# Patient Record
Sex: Female | Born: 1955 | Race: White | Hispanic: No | Marital: Married | Smoking: Never smoker
Health system: Southern US, Community
[De-identification: ages and names within clinical notes are randomized; demographics above are authoritative.]

## PROBLEM LIST (undated history)

## (undated) DIAGNOSIS — G35 Multiple sclerosis: Secondary | ICD-10-CM

## (undated) DIAGNOSIS — R112 Nausea with vomiting, unspecified: Secondary | ICD-10-CM

## (undated) DIAGNOSIS — Z9889 Other specified postprocedural states: Secondary | ICD-10-CM

## (undated) HISTORY — PX: CHOLECYSTECTOMY: SHX55

---

## 1998-01-13 ENCOUNTER — Other Ambulatory Visit: Admission: RE | Admit: 1998-01-13 | Discharge: 1998-01-13 | Payer: Self-pay | Admitting: *Deleted

## 1999-05-04 ENCOUNTER — Encounter: Admission: RE | Admit: 1999-05-04 | Discharge: 1999-05-04 | Payer: Self-pay | Admitting: *Deleted

## 1999-05-14 ENCOUNTER — Other Ambulatory Visit: Admission: RE | Admit: 1999-05-14 | Discharge: 1999-05-14 | Payer: Self-pay | Admitting: *Deleted

## 2000-03-09 ENCOUNTER — Encounter: Admission: RE | Admit: 2000-03-09 | Discharge: 2000-03-09 | Payer: Self-pay | Admitting: Family Medicine

## 2000-03-09 ENCOUNTER — Encounter: Payer: Self-pay | Admitting: Family Medicine

## 2000-07-20 ENCOUNTER — Encounter: Admission: RE | Admit: 2000-07-20 | Discharge: 2000-07-20 | Payer: Self-pay | Admitting: *Deleted

## 2000-08-10 ENCOUNTER — Other Ambulatory Visit: Admission: RE | Admit: 2000-08-10 | Discharge: 2000-08-10 | Payer: Self-pay | Admitting: *Deleted

## 2001-09-14 ENCOUNTER — Encounter: Admission: RE | Admit: 2001-09-14 | Discharge: 2001-09-14 | Payer: Self-pay | Admitting: *Deleted

## 2001-09-18 ENCOUNTER — Other Ambulatory Visit: Admission: RE | Admit: 2001-09-18 | Discharge: 2001-09-18 | Payer: Self-pay | Admitting: *Deleted

## 2002-09-16 ENCOUNTER — Encounter: Admission: RE | Admit: 2002-09-16 | Discharge: 2002-09-16 | Payer: Self-pay | Admitting: *Deleted

## 2002-09-24 ENCOUNTER — Other Ambulatory Visit: Admission: RE | Admit: 2002-09-24 | Discharge: 2002-09-24 | Payer: Self-pay | Admitting: *Deleted

## 2003-10-21 ENCOUNTER — Encounter: Admission: RE | Admit: 2003-10-21 | Discharge: 2003-10-21 | Payer: Self-pay | Admitting: *Deleted

## 2003-11-07 ENCOUNTER — Encounter: Admission: RE | Admit: 2003-11-07 | Discharge: 2003-11-07 | Payer: Self-pay | Admitting: *Deleted

## 2004-03-31 ENCOUNTER — Encounter: Admission: RE | Admit: 2004-03-31 | Discharge: 2004-03-31 | Payer: Self-pay | Admitting: *Deleted

## 2004-11-04 ENCOUNTER — Encounter: Admission: RE | Admit: 2004-11-04 | Discharge: 2004-11-04 | Payer: Self-pay | Admitting: Neurology

## 2004-11-05 ENCOUNTER — Encounter: Admission: RE | Admit: 2004-11-05 | Discharge: 2004-11-05 | Payer: Self-pay | Admitting: *Deleted

## 2005-01-05 ENCOUNTER — Encounter: Admission: RE | Admit: 2005-01-05 | Discharge: 2005-01-05 | Payer: Self-pay | Admitting: *Deleted

## 2005-12-20 ENCOUNTER — Encounter: Admission: RE | Admit: 2005-12-20 | Discharge: 2005-12-20 | Payer: Self-pay | Admitting: *Deleted

## 2006-12-27 ENCOUNTER — Encounter: Admission: RE | Admit: 2006-12-27 | Discharge: 2006-12-27 | Payer: Self-pay | Admitting: Family Medicine

## 2008-01-22 ENCOUNTER — Encounter: Admission: RE | Admit: 2008-01-22 | Discharge: 2008-01-22 | Payer: Self-pay | Admitting: Family Medicine

## 2008-01-25 ENCOUNTER — Encounter: Admission: RE | Admit: 2008-01-25 | Discharge: 2008-01-25 | Payer: Self-pay | Admitting: Family Medicine

## 2008-04-08 ENCOUNTER — Ambulatory Visit (HOSPITAL_COMMUNITY): Admission: RE | Admit: 2008-04-08 | Discharge: 2008-04-08 | Payer: Self-pay | Admitting: General Surgery

## 2008-04-08 ENCOUNTER — Encounter (INDEPENDENT_AMBULATORY_CARE_PROVIDER_SITE_OTHER): Payer: Self-pay | Admitting: General Surgery

## 2008-07-21 ENCOUNTER — Encounter: Admission: RE | Admit: 2008-07-21 | Discharge: 2008-07-21 | Payer: Self-pay | Admitting: Family Medicine

## 2009-02-09 ENCOUNTER — Encounter: Admission: RE | Admit: 2009-02-09 | Discharge: 2009-02-09 | Payer: Self-pay | Admitting: Family Medicine

## 2009-03-05 ENCOUNTER — Other Ambulatory Visit: Admission: RE | Admit: 2009-03-05 | Discharge: 2009-03-05 | Payer: Self-pay | Admitting: Family Medicine

## 2010-02-11 ENCOUNTER — Encounter: Admission: RE | Admit: 2010-02-11 | Discharge: 2010-02-11 | Payer: Self-pay | Admitting: Family Medicine

## 2010-09-14 NOTE — Op Note (Signed)
NAMEMARKEESHA, [name]                 ACCOUNT NO.:  [id number]   MEDICAL RECORD NO.:  [id number]          PATIENT TYPE:  AMB   LOCATION:  DAY                          FACILITY:  [facility]   PHYSICIAN:  Adolph Pollack, M.D.DATE OF BIRTH:  1956/03/16   DATE OF PROCEDURE:  04/08/2008  DATE OF DISCHARGE:                               OPERATIVE REPORT   PREOPERATIVE DIAGNOSIS:  Symptomatic cholelithiasis.   POSTOPERATIVE DIAGNOSIS:  Symptomatic cholelithiasis.   PROCEDURE:  Laparoscopic cholecystectomy with intraoperative  cholangiogram.   SURGEON:  Adolph Pollack, M.D.   ASSISTANT:  Ollen Gross. Carolynne Edouard, MD   ANESTHESIA:  General.   INDICATIONS:  [name] has had some significant episodes of right  upper quadrant pain and was found to have a large gallstone.  Her  symptoms are consistent with biliary colic.  She has had a number of  episodes.  She now is brought to the operating room for elective  cholecystectomy.  The procedure, risks and aftercare were discussed with  her preoperatively.   TECHNIQUE:  She was seen in the holding area and then brought to the  operating room, placed supine on the operating table.  General  anesthetic was administered.  The abdominal wall was sterilely prepped  and draped.  Marcaine was infiltrated in the subumbilical region.  A  subumbilical incision was made through the skin, subcutaneous tissue,  fascia and peritoneum entering the peritoneal cavity under direct  vision.  A pursestring suture of 0 Vicryl was placed around the fascial  edges.  A Hasson trocar was introduced into the peritoneal cavity and  pneumoperitoneum created by insufflation of CO2 gas.   Next laparoscope was introduced and she was placed in the reverse  Trendelenburg position with the right side tilted slightly up.  An 11-mm  trocar was placed in the epigastric incision and two 5 mm trocar was  placed in right upper quadrant.   The fundus of gallbladder was grasped and thin  adhesions between the  body of the gallbladder and the duodenum and omentum were taken down  bluntly and sharply, staying close to the gallbladder.  I then mobilized  the infundibulum with dissection on the gallbladder and retracted it  laterally.  I exposed the cystic duct and created a window around it  achieving a critical view.  A clip was placed above the cystic duct  gallbladder junction and a small incision made at the cystic duct  gallbladder junction.  A cholangiocath was passed through the anterior  abdominal wall, placed into the cystic duct and cholangiogram was  performed.   Under real time fluoroscopy, dilute contrast was injected into the  cystic duct which was of long length.  The common hepatic, right and  left hepatic, common bile ducts all filled promptly and contrast drained  promptly into the duodenum without obvious evidence of obstruction.  Final reports pending radiologist's interpretation.   The cholangiocatheter was removed, the cystic duct was clipped three  times on the biliary side and divided.  I then identified the cystic  artery, created a window around it, clipped  it and divided it.  Gallbladder was dissected from liver using electrocautery and placed in  the Endopouch bag.   I copiously irrigated out the gallbladder fossa and controlled bleeding  points with electrocautery.  Surgicel was then placed in the gallbladder  fossa.  Irrigation fluid was evacuated much as possible.  Further  inspection demonstrated no bleeding and no bile leak.   The gallbladder was removed through the subumbilical port in the  Endopouch bag and the subumbilical fascial defect was closed by  tightening up and tying down the pursestring suture.  The remaining  trocars were removed and the pneumoperitoneum was released.  Skin  incisions were closed with 4-0 Monocryl subcuticular stitches followed  by Steri-Strips and sterile dressing.   She tolerated the procedure without  any apparent complications and was  taken to recovery room in satisfactory condition.      Adolph Pollack, M.D.  Electronically Signed     TJR/MEDQ  D:  04/08/2008  T:  04/08/2008  Job:  [id number]   cc:   [name], M.D.  Fax: [phone]

## 2011-01-10 ENCOUNTER — Other Ambulatory Visit: Payer: Self-pay | Admitting: Family Medicine

## 2011-01-10 DIAGNOSIS — Z1231 Encounter for screening mammogram for malignant neoplasm of breast: Secondary | ICD-10-CM

## 2011-02-04 LAB — DIFFERENTIAL
Basophils Relative: 1 % (ref 0–1)
Lymphocytes Relative: 29 % (ref 12–46)
Lymphs Abs: 2.1 10*3/uL (ref 0.7–4.0)
Monocytes Relative: 9 % (ref 3–12)
Neutrophils Relative %: 60 % (ref 43–77)

## 2011-02-04 LAB — CBC
HCT: 40 % (ref 36.0–46.0)
Hemoglobin: 13.7 g/dL (ref 12.0–15.0)
MCHC: 34.3 g/dL (ref 30.0–36.0)

## 2011-02-04 LAB — COMPREHENSIVE METABOLIC PANEL
BUN: 7 mg/dL (ref 6–23)
CO2: 27 mEq/L (ref 19–32)
Chloride: 106 mEq/L (ref 96–112)
Creatinine, Ser: 0.67 mg/dL (ref 0.4–1.2)
GFR calc non Af Amer: >60 mL/min (ref >60)
Total Bilirubin: 0.7 mg/dL (ref 0.3–1.2)

## 2011-02-04 LAB — PREGNANCY, URINE: Preg Test, Ur: NEGATIVE

## 2011-02-22 ENCOUNTER — Ambulatory Visit
Admission: RE | Admit: 2011-02-22 | Discharge: 2011-02-22 | Disposition: A | Discharge status: Home / Self Care | Payer: PRIVATE HEALTH INSURANCE | Source: Ambulatory Visit | Attending: Family Medicine | Admitting: Family Medicine

## 2011-02-22 DIAGNOSIS — Z1231 Encounter for screening mammogram for malignant neoplasm of breast: Secondary | ICD-10-CM

## 2011-05-05 ENCOUNTER — Observation Stay (HOSPITAL_COMMUNITY)
Admission: EM | Admit: 2011-05-05 | Discharge: 2011-05-07 | Disposition: A | Discharge status: Home / Self Care | Payer: PRIVATE HEALTH INSURANCE | Attending: Internal Medicine | Admitting: Internal Medicine

## 2011-05-05 ENCOUNTER — Other Ambulatory Visit: Payer: Self-pay

## 2011-05-05 ENCOUNTER — Emergency Department (HOSPITAL_COMMUNITY): Payer: PRIVATE HEALTH INSURANCE

## 2011-05-05 ENCOUNTER — Encounter: Payer: Self-pay | Admitting: Emergency Medicine

## 2011-05-05 DIAGNOSIS — M47814 Spondylosis without myelopathy or radiculopathy, thoracic region: Secondary | ICD-10-CM | POA: Insufficient documentation

## 2011-05-05 DIAGNOSIS — R0789 Other chest pain: Principal | ICD-10-CM | POA: Insufficient documentation

## 2011-05-05 DIAGNOSIS — G35 Multiple sclerosis: Secondary | ICD-10-CM | POA: Insufficient documentation

## 2011-05-05 DIAGNOSIS — R079 Chest pain, unspecified: Secondary | ICD-10-CM | POA: Diagnosis present

## 2011-05-05 DIAGNOSIS — Z7982 Long term (current) use of aspirin: Secondary | ICD-10-CM | POA: Insufficient documentation

## 2011-05-05 DIAGNOSIS — Z79899 Other long term (current) drug therapy: Secondary | ICD-10-CM | POA: Insufficient documentation

## 2011-05-05 DIAGNOSIS — I700 Atherosclerosis of aorta: Secondary | ICD-10-CM | POA: Insufficient documentation

## 2011-05-05 HISTORY — DX: Other specified postprocedural states: R11.2

## 2011-05-05 HISTORY — DX: Other specified postprocedural states: Z98.890

## 2011-05-05 HISTORY — DX: Multiple sclerosis: G35

## 2011-05-05 LAB — POCT I-STAT, CHEM 8
BUN: 13 mg/dL (ref 6–23)
Calcium, Ion: 1.16 mmol/L (ref 1.12–1.32)
Chloride: 108 mEq/L (ref 96–112)
Glucose, Bld: 92 mg/dL (ref 70–99)

## 2011-05-05 LAB — D-DIMER, QUANTITATIVE: D-Dimer, Quant: 0.22 ug/mL-FEU (ref 0.00–0.48)

## 2011-05-05 MED ORDER — MORPHINE SULFATE 4 MG/ML IJ SOLN
4.0000 mg | Freq: Once | INTRAMUSCULAR | Status: AC
  Administered 2011-05-05: 4 mg via INTRAVENOUS
  Filled 2011-05-05: qty 1

## 2011-05-05 MED ORDER — ONDANSETRON HCL 4 MG/2ML IJ SOLN
4.0000 mg | Freq: Once | INTRAMUSCULAR | Status: AC
  Administered 2011-05-05: 4 mg via INTRAVENOUS
  Filled 2011-05-05: qty 2

## 2011-05-05 MED ORDER — NITROGLYCERIN 0.4 MG SL SUBL
0.4000 mg | SUBLINGUAL_TABLET | SUBLINGUAL | Status: DC | PRN
  Administered 2011-05-05 – 2011-05-06 (×2): 0.4 mg via SUBLINGUAL
  Filled 2011-05-05: qty 25

## 2011-05-05 MED ORDER — SODIUM CHLORIDE 0.9 % IV SOLN
Freq: Once | INTRAVENOUS | Status: AC
  Administered 2011-05-05: 23:00:00 via INTRAVENOUS

## 2011-05-05 NOTE — ED Notes (Signed)
Patient states that she has had chest pain since this morning while getting dressed. Indicates midsternal chest pain that, at times, radiates into her back, neck and underneath her breasts. Rates chest pain  a "3" at present time. Patient denied nausea, vomiting or diaphoresis.

## 2011-05-05 NOTE — ED Provider Notes (Addendum)
History     CSN: [id number]  Arrival date & time 05/05/11  2003   First MD Initiated Contact with Patient 05/05/11 2205      Chief Complaint  Patient presents with  . Chest Pain    (Consider location/radiation/quality/duration/timing/severity/associated sxs/prior treatment) HPI Comments: Ms. Blattner has a history of a mass with numbness and tingling to the side of her face and feet, but full mobility has developed left chest pain, today woke with it and has been intermittent throughout the day.  She says this pressure like something is sitting on her chest.  It is associated with shortness of breath, but not nausea or diaphoresis.  She has never had this kind of pain before.  A friend gave her baby aspirin and brought her to the emergency room for evaluation.  She has no risk factors for DVT.  Other than her underlying medical condition  Patient is a 56 y.o. female presenting with chest pain. The history is provided by the patient.  Chest Pain The chest pain began 6 - 12 hours ago. Chest pain occurs intermittently. The chest pain is worsening. The pain is associated with breathing. At its most intense, the pain is at 5/10. The pain is currently at 3/10. The severity of the pain is moderate. The quality of the pain is described as heavy. The pain does not radiate. Pertinent negatives for primary symptoms include no fever, no shortness of breath, no cough, no palpitations, no nausea, no vomiting and no dizziness.  Associated symptoms include numbness.  Pertinent negatives for associated symptoms include no diaphoresis, no lower extremity edema, no near-syncope and no weakness. She tried nothing for the symptoms. Risk factors include no known risk factors.     Past Medical History  Diagnosis Date  . Multiple sclerosis     Past Surgical History  Procedure Date  . Cholecystectomy     Family History  Problem Relation Age of Onset  . Diabetes Mother   . Diabetes Father   . Diabetes  Sister     History  Substance Use Topics  . Smoking status: Never Smoker   . Smokeless tobacco: Not on file  . Alcohol Use: Yes     social    OB History    Grav Para Term Preterm Abortions TAB SAB Ect Mult Living                  Review of Systems  Constitutional: Negative for fever and diaphoresis.  HENT: Negative for sore throat and rhinorrhea.   Respiratory: Negative for cough and shortness of breath.   Cardiovascular: Positive for chest pain. Negative for palpitations, leg swelling and near-syncope.  Gastrointestinal: Negative for nausea, vomiting, diarrhea and constipation.  Genitourinary: Negative for dysuria and frequency.  Neurological: Positive for numbness. Negative for dizziness, weakness, light-headedness and headaches.  Hematological: Negative.   Psychiatric/Behavioral: Negative.     Allergies  Sulfa antibiotics  Home Medications   Current Outpatient Rx  Name Route Sig Dispense Refill  . ASPIRIN 81 MG PO CHEW Oral Chew 81 mg by mouth daily.      . BUPROPION HCL ER (XL) 300 MG PO TB24 Oral Take 300 mg by mouth daily.      . IBUPROFEN 200 MG PO TABS Oral Take 600 mg by mouth every 6 (six) hours as needed.      . INTERFERON BETA-1A 30 MCG/0.5ML IM KIT Intramuscular Inject 30 mcg into the muscle every 7 (seven) days.  BP 117/74  Pulse 94  Temp(Src) 98 F (36.7 C) (Oral)  Resp 20  Wt 130 lb (58.968 kg)  SpO2 97%  Physical Exam  Constitutional: She appears well-developed and well-nourished.  HENT:  Head: Normocephalic.  Eyes: Pupils are equal, round, and reactive to light.  Neck: Normal range of motion.  Cardiovascular: Normal rate and regular rhythm.   Pulmonary/Chest: No accessory muscle usage. No respiratory distress.       anterior upper  L chest wall tender without rash, bruising   Abdominal: Soft. Bowel sounds are normal.  Musculoskeletal: She exhibits no edema and no tenderness.  Neurological: She is alert.  Skin: Skin is warm and dry.     ED Course  Procedures (including critical care time)   Labs Reviewed  POCT I-STAT, CHEM 8  I-STAT TROPONIN I  I-STAT, CHEM 8  D-DIMER, QUANTITATIVE   No results found.   No diagnosis found.  ED ECG REPORT   Date: 05/05/2011  EKG Time: 10:22 PM  Rate: 81  Rhythm: normal sinus rhythm,  normal EKG, normal sinus rhythm, unchanged from previous tracings, there are no previous tracings available for comparison  Axis: normal Intervals:none  ST&T Change: none  Narrative Interpretation: normal       Patient developed an increase in chest pain suddenly was given sublingual nitroglycerin with almost total relief.  EKG was repeated with no change in findings is concerning and will most likely admit this patient for further evaluation  Spoke with Dr. Carolanne Grumbling cardiology would be glad to consult in the morning.  If triad makes that request   MDM  Will evaluate chest x-ray and labs, a consideration for cardiac chest pain, versus PE versus pneumonia        Arman Filter, NP 05/05/11 2223  Arman Filter, NP 05/05/11 2348  Arman Filter, NP 05/06/11 [phone]

## 2011-05-05 NOTE — ED Notes (Signed)
Pt states a little over a week ago she had an episode of chest pain that felt "like an elephant on my chest"  Pt states it went away  Pt states since it has been bothering her off and on but today she woke with it and it has progressed all day long  Pt is tearful in triage  Family states she gave her a baby aspirin at home prior to coming here  Pt states she feels like she cannot get a good breath and states she has some discomfort in her neck area

## 2011-05-06 ENCOUNTER — Emergency Department (HOSPITAL_COMMUNITY): Payer: PRIVATE HEALTH INSURANCE

## 2011-05-06 ENCOUNTER — Encounter (HOSPITAL_COMMUNITY): Payer: Self-pay | Admitting: Family Medicine

## 2011-05-06 DIAGNOSIS — R079 Chest pain, unspecified: Secondary | ICD-10-CM | POA: Diagnosis present

## 2011-05-06 DIAGNOSIS — G35 Multiple sclerosis: Secondary | ICD-10-CM | POA: Diagnosis present

## 2011-05-06 LAB — BASIC METABOLIC PANEL
BUN: 12 mg/dL (ref 6–23)
CO2: 26 mEq/L (ref 19–32)
Chloride: 103 mEq/L (ref 96–112)
GFR calc Af Amer: >90 mL/min (ref >90)
Potassium: 4.2 mEq/L (ref 3.5–5.1)

## 2011-05-06 LAB — CBC
HCT: 38.8 % (ref 36.0–46.0)
Hemoglobin: 13 g/dL (ref 12.0–15.0)
MCHC: 33.5 g/dL (ref 30.0–36.0)
MCV: 89.4 fL (ref 78.0–100.0)
WBC: 6.5 10*3/uL (ref 4.0–10.5)

## 2011-05-06 LAB — CARDIAC PANEL(CRET KIN+CKTOT+MB+TROPI)
CK, MB: 2.4 ng/mL (ref 0.3–4.0)
CK, MB: 1.9 ng/mL (ref 0.3–4.0)
Relative Index: INVALID (ref 0.0–2.5)
Relative Index: INVALID (ref 0.0–2.5)
Total CK: 65 U/L (ref 7–177)
Total CK: 57 U/L (ref 7–177)
Total CK: 53 U/L (ref 7–177)

## 2011-05-06 LAB — LIPID PANEL
HDL: 58 mg/dL (ref >39)
LDL Cholesterol: 110 mg/dL — ABNORMAL HIGH (ref 0–99)
Triglycerides: 114 mg/dL (ref <150)
VLDL: 23 mg/dL (ref 0–40)

## 2011-05-06 LAB — MAGNESIUM: Magnesium: 2.3 mg/dL (ref 1.5–2.5)

## 2011-05-06 LAB — TSH: TSH: 2.495 u[IU]/mL (ref 0.350–4.500)

## 2011-05-06 MED ORDER — ENOXAPARIN SODIUM 40 MG/0.4ML ~~LOC~~ SOLN
40.0000 mg | SUBCUTANEOUS | Status: DC
  Administered 2011-05-06 – 2011-05-07 (×2): 40 mg via SUBCUTANEOUS
  Filled 2011-05-06 (×4): qty 0.4

## 2011-05-06 MED ORDER — PANTOPRAZOLE SODIUM 40 MG PO TBEC
40.0000 mg | DELAYED_RELEASE_TABLET | Freq: Every day | ORAL | Status: DC
  Administered 2011-05-06 – 2011-05-07 (×2): 40 mg via ORAL
  Filled 2011-05-06 (×4): qty 1

## 2011-05-06 MED ORDER — NAPROXEN 375 MG PO TABS
375.0000 mg | ORAL_TABLET | Freq: Three times a day (TID) | ORAL | Status: DC | PRN
  Administered 2011-05-06: 375 mg via ORAL
  Filled 2011-05-06: qty 1

## 2011-05-06 MED ORDER — ACETAMINOPHEN 650 MG RE SUPP: 650.0000 mg | Freq: Four times a day (QID) | RECTAL | Status: DC | PRN

## 2011-05-06 MED ORDER — IOHEXOL 300 MG/ML  SOLN
80.0000 mL | Freq: Once | INTRAMUSCULAR | Status: AC | PRN
  Administered 2011-05-06: 80 mL via INTRAVENOUS

## 2011-05-06 MED ORDER — MORPHINE SULFATE 2 MG/ML IJ SOLN: 2.0000 mg | INTRAMUSCULAR | Status: DC | PRN

## 2011-05-06 MED ORDER — INFLUENZA VIRUS VACC SPLIT PF IM SUSP
0.5000 mL | INTRAMUSCULAR | Status: AC
  Administered 2011-05-07: 0.5 mL via INTRAMUSCULAR
  Filled 2011-05-06: qty 0.5

## 2011-05-06 MED ORDER — ACETAMINOPHEN 325 MG PO TABS
650.0000 mg | ORAL_TABLET | Freq: Four times a day (QID) | ORAL | Status: DC | PRN
  Administered 2011-05-06 – 2011-05-07 (×3): 650 mg via ORAL
  Filled 2011-05-06 (×3): qty 2

## 2011-05-06 MED ORDER — ASPIRIN 325 MG PO TABS
325.0000 mg | ORAL_TABLET | Freq: Every day | ORAL | Status: DC
  Administered 2011-05-06 – 2011-05-07 (×2): 325 mg via ORAL
  Filled 2011-05-06 (×2): qty 1

## 2011-05-06 MED ORDER — INTERFERON BETA-1A 30 MCG/0.5ML IM KIT: 30.0000 ug | PACK | INTRAMUSCULAR | Status: DC

## 2011-05-06 MED ORDER — SODIUM CHLORIDE 0.9 % IV SOLN
INTRAVENOUS | Status: DC
  Administered 2011-05-07: 100 mL/h via INTRAVENOUS

## 2011-05-06 MED ORDER — BUPROPION HCL ER (XL) 300 MG PO TB24
300.0000 mg | ORAL_TABLET | Freq: Every day | ORAL | Status: DC
  Administered 2011-05-06 – 2011-05-07 (×2): 300 mg via ORAL
  Filled 2011-05-06 (×3): qty 1

## 2011-05-06 NOTE — H&P (Signed)
PCP:  Dr Laurann Montana  Chief Complaint:  Chest pain  HPI: This is a 56 year old female with history of mild multiple sclerosis. A few weeks ago she developed some chest pains, it felt as though something heavy was on her chest. That resolved. This a.m. she woke up with chest pains, it was centrally located and constant. At its worse it was 7/10. There was no radiation. She reports no nausea no vomiting no diaphoresis and no palpitation and it does not feel like heartburn. She reports no shortness of breath no fevers no chills and no cough. It is not worse with activity but it is worse and with movement of her torso. She reports no history of chest pains. she's never had a stress test. She describes the pain as heavy. It did improve with nitroglycerin the ER. Currently she describes the pain as 4/10. History provided by the patient.  [place] of Systems: positives bolded The patient denies anorexia, fever, weight loss,, vision loss, decreased hearing, hoarseness, chest pain, syncope, dyspnea on exertion, peripheral edema, balance deficits, hemoptysis, abdominal pain, melena, hematochezia, severe indigestion/heartburn, hematuria, incontinence, genital sores, muscle weakness, suspicious skin lesions, transient blindness, difficulty walking, depression, unusual weight change, abnormal bleeding, enlarged lymph nodes, angioedema, and breast masses.  Past Medical History: Past Medical History  Diagnosis Date  . Multiple sclerosis    Past Surgical History  Procedure Date  . Cholecystectomy     Medications: Prior to Admission medications   Medication Sig Start Date End Date Taking? Authorizing Provider  aspirin 81 MG chewable tablet Chew 81 mg by mouth daily.     Yes Historical Provider, MD  buPROPion (WELLBUTRIN XL) 300 MG 24 hr tablet Take 300 mg by mouth daily.     Yes Historical Provider, MD  ibuprofen (ADVIL,MOTRIN) 200 MG tablet Take 600 mg by mouth every 6 (six) hours as needed.     Yes  Historical Provider, MD  interferon beta-1a (AVONEX) 30 MCG/0.5ML injection Inject 30 mcg into the muscle every 7 (seven) days.     Yes Historical Provider, MD    Allergies:   Allergies  Allergen Reactions  . Sulfa Antibiotics Hives    Social History:  reports that she has never smoked. She does not have any smokeless tobacco history on file. She reports that she drinks alcohol. She reports that she does not use illicit drugs.  Family History: Family History  Problem Relation Age of Onset  . Diabetes Mother   . Diabetes Father   . Diabetes Sister     Physical Exam: Filed Vitals:   05/05/11 2240 05/05/11 2334 05/06/11 0018 05/06/11 0300  BP: 108/60 111/65 94/62 111/71  Pulse: 81  77 86  Temp:   98.8 F (37.1 C)   TempSrc:      Resp: 13 19 16 18   Weight:      SpO2: 100% 100% 100% 100%    General:  Alert and oriented times three, well developed and nourished, no acute distress Eyes: PERRLA, pink conjunctiva, no scleral icterus ENT: Moist oral mucosa, neck supple, no thyromegaly Lungs: clear to ascultation, no wheeze, no crackles, no use of accessory muscles Cardiovascular: regular rate and rhythm, no regurgitation, no gallops, no murmurs. No carotid bruits, no JVD Abdomen: soft, positive BS, non-tender, non-distended, no organomegaly, not an acute abdomen GU: not examined Neuro: CN II - XII grossly intact, sensation intact Musculoskeletal: strength 5/5 all extremities, no clubbing, cyanosis or edema, reproducible chest wall pain Skin: no rash, no subcutaneous crepitation,  no decubitus Psych: appropriate patient   Labs on Admission:   [facility] 05/05/11 2204  NA 143  K 3.7  CL 108  CO2 --  GLUCOSE 92  BUN 13  CREATININE 1.00  CALCIUM --  MG --  PHOS --   No results found for this basename: AST:2,ALT:2,ALKPHOS:2,BILITOT:2,PROT:2,ALBUMIN:2 in the last 72 hours No results found for this basename: LIPASE:2,AMYLASE:2 in the last 72 hours  Basename 05/05/11 2204    WBC --  NEUTROABS --  HGB 14.3  HCT 42.0  MCV --  PLT --    Basename 05/06/11 0032  CKTOTAL 65  CKMB 2.4  CKMBINDEX --  TROPONINI <0.30   No components found with this basename: POCBNP:3  Basename 05/05/11 2200  DDIMER 0.22   No results found for this basename: HGBA1C:2 in the last 72 hours No results found for this basename: CHOL:2,HDL:2,LDLCALC:2,TRIG:2,CHOLHDL:2,LDLDIRECT:2 in the last 72 hours No results found for this basename: TSH,T4TOTAL,FREET3,T3FREE,THYROIDAB in the last 72 hours No results found for this basename: VITAMINB12:2,FOLATE:2,FERRITIN:2,TIBC:2,IRON:2,RETICCTPCT:2 in the last 72 hours  Micro Results: No results found for this or any previous visit (from the past 240 hour(s)).   Radiological Exams on Admission: Dg Chest Port 1 View  05/05/2011  *RADIOLOGY REPORT*  Clinical Data: Chest pain.  PORTABLE CHEST - 1 VIEW  Comparison: 04/03/2008  Findings: Heart and mediastinal contours are within normal limits. No focal opacities or effusions.  No acute bony abnormality.  IMPRESSION: No active cardiopulmonary disease.  Original Report Authenticated By: Cyndie Chime, M.D.   EKG: NSR  Assessment/Plan Present on Admission:  .Chest pain- atypical  Admit to telemetry, certainly a musculoskeletal element to patient's chest pain Aspirin, lipid panel ordered, will cycle cardiac enzymes  Nitroglycerin ordered. Multiple sclerosis Stable   Full code  DVT prophylaxis Team 2/Dr. Donzetta Sprung, Rani Idler 05/06/2011, 4:43 AM

## 2011-05-06 NOTE — ED Notes (Signed)
Pt c/o h/a. Left message for mid level. Awaiting call back.

## 2011-05-06 NOTE — ED Notes (Signed)
Crosley, MD at bedside. 

## 2011-05-06 NOTE — ED Notes (Signed)
Patient c/o midsternal chest pressure. States "it feels like someone is pushing on my chest. I'm very uncomfortable. I feel worse than when I got here."

## 2011-05-06 NOTE — ED Notes (Signed)
Patient c/o headache. Rates pain "7".

## 2011-05-06 NOTE — Progress Notes (Signed)
I have seen and examined [name] at bed side in the presence of her husband. i have also reviewed her chart. She is a very pleasant lady admitted with pressure like central chest pain ongoing for the last 2 weeks. She has MS. Differentials include- angina equivalent- lung disease- upper gi. Will obtain ct chest/2decho/add ppi/ivf, otherwise pliz see rest of plan per Dr Haroldine Laws. [name],MD pager [phone].

## 2011-05-06 NOTE — ED Notes (Signed)
Report received from [place].  Pt. Resting comfortably with spouse at bedside.  Denies Chest Pain.  Respirations even unlabored. VSS.

## 2011-05-06 NOTE — ED Provider Notes (Signed)
Medical screening examination/treatment/procedure(s) were performed by non-physician practitioner and as supervising physician I was immediately available for consultation/collaboration.  Flint Melter, MD 05/06/11 [phone]

## 2011-05-07 DIAGNOSIS — R072 Precordial pain: Secondary | ICD-10-CM

## 2011-05-07 LAB — COMPREHENSIVE METABOLIC PANEL
Albumin: 3.4 g/dL — ABNORMAL LOW (ref 3.5–5.2)
Alkaline Phosphatase: 54 U/L (ref 39–117)
BUN: 13 mg/dL (ref 6–23)
CO2: 25 mEq/L (ref 19–32)
Chloride: 107 mEq/L (ref 96–112)
Creatinine, Ser: 0.75 mg/dL (ref 0.50–1.10)
Sodium: 138 mEq/L (ref 135–145)
Total Protein: 6.4 g/dL (ref 6.0–8.3)

## 2011-05-07 LAB — CBC
Hemoglobin: 12.8 g/dL (ref 12.0–15.0)
MCH: 29.8 pg (ref 26.0–34.0)
MCV: 89.5 fL (ref 78.0–100.0)
Platelets: 220 10*3/uL (ref 150–400)
RBC: 4.29 MIL/uL (ref 3.87–5.11)
WBC: 5.5 10*3/uL (ref 4.0–10.5)

## 2011-05-07 LAB — CARDIAC PANEL(CRET KIN+CKTOT+MB+TROPI)
CK, MB: 1.6 ng/mL (ref 0.3–4.0)
Relative Index: INVALID (ref 0.0–2.5)
Total CK: 46 U/L (ref 7–177)
Troponin I: <0.3 ng/mL (ref <0.30)

## 2011-05-07 LAB — LIPID PANEL: Cholesterol: 179 mg/dL (ref 0–200)

## 2011-05-07 MED ORDER — PANTOPRAZOLE SODIUM 40 MG PO TBEC: 40.0000 mg | DELAYED_RELEASE_TABLET | Freq: Every day | ORAL | Status: DC

## 2011-05-07 NOTE — Progress Notes (Signed)
  Echocardiogram 2D Echocardiogram has been performed.  [name] 05/07/2011, 10:28 AM

## 2011-05-07 NOTE — Plan of Care (Signed)
Problem: Phase I Progression Outcomes Goal: Other Phase I Outcomes/Goals 2-D echo performed today @ bedside-no c/o chest pain; no apparent distress post-procedure.

## 2011-05-07 NOTE — Discharge Summary (Signed)
DISCHARGE SUMMARY  [name]  MR#: [id number]  DOB:Sep 05, 1955  Date of Admission: 05/05/2011 Date of Discharge: 05/07/2011  Attending Physician:Ary Lavine  Patient's PCP:No primary provider on file.  Consults: none  Discharge Diagnoses: Present on Admission:  .Chest pain likely due to GERD versus costochondritis. MI ruled out by cardiac enzymes. .Multiple sclerosis    Current Discharge Medication List    START taking these medications   Details  pantoprazole (PROTONIX) 40 MG tablet Take 1 tablet (40 mg total) by mouth daily at 12 noon. Qty: 30 tablet, Refills: 0      CONTINUE these medications which have NOT CHANGED   Details  aspirin 81 MG chewable tablet Chew 81 mg by mouth daily.      buPROPion (WELLBUTRIN XL) 300 MG 24 hr tablet Take 300 mg by mouth daily.      ibuprofen (ADVIL,MOTRIN) 200 MG tablet Take 600 mg by mouth every 6 (six) hours as needed.      interferon beta-1a (AVONEX) 30 MCG/0.5ML injection Inject 30 mcg into the muscle every 7 (seven) days.            Hospital Course: [name] was admitted on 05/06/11 with chest pain on and off. The pain has resolved. Her work up so far included cardiac enzymes-negative, d.dimer-negative, ct chest with contrast-normal, EKG-normal  2decho- result pending at d/c. She was started on a ppi, which could be helping if gerd. She is hemodynamically stable and chest pain free, and elects to follow with Dr Cliffton Asters to arrange an outpatient stress test. If negative, would consider egd. I will call her with echo results. Discussed this plan of care with her husband.   Day of Discharge BP 99/66  Pulse 75  Temp(Src) 97.5 F (36.4 C) (Oral)  Resp 19  Ht 5\' 3"  (1.6 m)  Wt 59.603 kg (131 lb 6.4 oz)  BMI 23.28 kg/m2  SpO2 99%  Physical Exam: At baseline.  Results for orders placed during the hospital encounter of 05/05/11 (from the past 24 hour(s))  TROPONIN I     Status: Normal   Collection Time   05/06/11  4:00 PM     Component Value Range   Troponin I <0.30  <0.30 (ng/mL)  CARDIAC PANEL(CRET KIN+CKTOT+MB+TROPI)     Status: Normal   Collection Time   05/06/11  9:10 PM      Component Value Range   Total CK 53  7 - 177 (U/L)   CK, MB 1.8  0.3 - 4.0 (ng/mL)   Troponin I <0.30  <0.30 (ng/mL)   Relative Index RELATIVE INDEX IS INVALID  0.0 - 2.5   CARDIAC PANEL(CRET KIN+CKTOT+MB+TROPI)     Status: Normal   Collection Time   05/07/11  4:58 AM      Component Value Range   Total CK 46  7 - 177 (U/L)   CK, MB 1.6  0.3 - 4.0 (ng/mL)   Troponin I <0.30  <0.30 (ng/mL)   Relative Index RELATIVE INDEX IS INVALID  0.0 - 2.5   LIPID PANEL     Status: Abnormal   Collection Time   05/07/11  4:58 AM      Component Value Range   Cholesterol 179  0 - 200 (mg/dL)   Triglycerides 782  <956 (mg/dL)   HDL 49  >21 (mg/dL)   Total CHOL/HDL Ratio 3.7     VLDL 21  0 - 40 (mg/dL)   LDL Cholesterol 308 (*) 0 - 99 (mg/dL)  CBC  Status: Normal   Collection Time   05/07/11  4:59 AM      Component Value Range   WBC 5.5  4.0 - 10.5 (K/uL)   RBC 4.29  3.87 - 5.11 (MIL/uL)   Hemoglobin 12.8  12.0 - 15.0 (g/dL)   HCT 16.1  09.6 - 04.5 (%)   MCV 89.5  78.0 - 100.0 (fL)   MCH 29.8  26.0 - 34.0 (pg)   MCHC 33.3  30.0 - 36.0 (g/dL)   RDW 40.9  81.1 - 91.4 (%)   Platelets 220  150 - 400 (K/uL)  COMPREHENSIVE METABOLIC PANEL     Status: Abnormal   Collection Time   05/07/11  4:59 AM      Component Value Range   Sodium 138  135 - 145 (mEq/L)   Potassium 4.0  3.5 - 5.1 (mEq/L)   Chloride 107  96 - 112 (mEq/L)   CO2 25  19 - 32 (mEq/L)   Glucose, Bld 98  70 - 99 (mg/dL)   BUN 13  6 - 23 (mg/dL)   Creatinine, Ser 7.82  0.50 - 1.10 (mg/dL)   Calcium 9.3  8.4 - 95.6 (mg/dL)   Total Protein 6.4  6.0 - 8.3 (g/dL)   Albumin 3.4 (*) 3.5 - 5.2 (g/dL)   AST 15  0 - 37 (U/L)   ALT 17  0 - 35 (U/L)   Alkaline Phosphatase 54  39 - 117 (U/L)   Total Bilirubin 0.3  0.3 - 1.2 (mg/dL)   GFR calc non Af Amer >90  >90 (mL/min)   GFR calc Af  Amer >90  >90 (mL/min)    Disposition: home to follow Dr white next week.   Follow-up Appts: Discharge Orders    Future Orders Please Complete By Expires   Diet - low sodium heart healthy      Increase activity slowly         Follow-up with Dr.White in 1 week.   Tests Needing Follow-up: 2decho.  Time spent in discharge (includes decision making & examination of pt): 15 minutes.  Signed: Kynleigh Artz 05/07/2011, 3:12 PM

## 2011-05-07 NOTE — Plan of Care (Signed)
Problem: Phase I Progression Outcomes Goal: Aspirin unless contraindicated Outcome: Progressing Aspirin therapy in progress

## 2011-05-07 NOTE — ED Provider Notes (Signed)
Medical screening examination/treatment/procedure(s) were performed by non-physician practitioner and as supervising physician I was immediately available for consultation/collaboration.  Toy Baker, MD 05/07/11 [phone]

## 2011-05-17 NOTE — Progress Notes (Signed)
UR completed 

## 2011-06-15 ENCOUNTER — Other Ambulatory Visit (HOSPITAL_COMMUNITY): Payer: Self-pay | Admitting: Internal Medicine

## 2011-07-21 ENCOUNTER — Other Ambulatory Visit (HOSPITAL_COMMUNITY)
Admission: RE | Admit: 2011-07-21 | Discharge: 2011-07-21 | Disposition: A | Discharge status: Home / Self Care | Payer: No Typology Code available for payment source | Source: Ambulatory Visit | Attending: Family Medicine | Admitting: Family Medicine

## 2011-07-21 ENCOUNTER — Other Ambulatory Visit: Payer: Self-pay | Admitting: Family Medicine

## 2011-07-21 DIAGNOSIS — Z0000 Encounter for general adult medical examination without abnormal findings: Secondary | ICD-10-CM | POA: Insufficient documentation

## 2012-01-17 ENCOUNTER — Other Ambulatory Visit: Payer: Self-pay | Admitting: Family Medicine

## 2012-01-17 DIAGNOSIS — Z803 Family history of malignant neoplasm of breast: Secondary | ICD-10-CM

## 2012-01-17 DIAGNOSIS — Z1231 Encounter for screening mammogram for malignant neoplasm of breast: Secondary | ICD-10-CM

## 2012-02-28 ENCOUNTER — Ambulatory Visit
Admission: RE | Admit: 2012-02-28 | Discharge: 2012-02-28 | Disposition: A | Discharge status: Home / Self Care | Payer: PRIVATE HEALTH INSURANCE | Source: Ambulatory Visit | Attending: Family Medicine | Admitting: Family Medicine

## 2012-02-28 DIAGNOSIS — Z803 Family history of malignant neoplasm of breast: Secondary | ICD-10-CM

## 2012-02-28 DIAGNOSIS — Z1231 Encounter for screening mammogram for malignant neoplasm of breast: Secondary | ICD-10-CM

## 2012-09-11 ENCOUNTER — Ambulatory Visit: Payer: PRIVATE HEALTH INSURANCE | Admitting: Nurse Practitioner

## 2012-09-12 ENCOUNTER — Encounter: Payer: Self-pay | Admitting: Nurse Practitioner

## 2012-09-12 ENCOUNTER — Ambulatory Visit (INDEPENDENT_AMBULATORY_CARE_PROVIDER_SITE_OTHER): Payer: PRIVATE HEALTH INSURANCE | Admitting: Nurse Practitioner

## 2012-09-12 VITALS — BP 92/63 | HR 83

## 2012-09-12 DIAGNOSIS — G35 Multiple sclerosis: Secondary | ICD-10-CM

## 2012-09-12 DIAGNOSIS — Z5181 Encounter for therapeutic drug level monitoring: Secondary | ICD-10-CM | POA: Insufficient documentation

## 2012-09-12 DIAGNOSIS — Z79899 Other long term (current) drug therapy: Secondary | ICD-10-CM

## 2012-09-12 NOTE — Patient Instructions (Addendum)
Will repeat MRI of the brain and compare it to 2006 Continue Avonex weekly Continue exercise and healthy diet for overall health and well-being Followup yearly and when necessary Please have Dr. Loreta Ave send [place] recent labs.

## 2012-09-12 NOTE — Progress Notes (Signed)
HPI: Returns for followup after last visit 05/19/2011. She has a history of multiple sclerosis and has been followed by our office since June 2006 however symptoms date back 20 years prior to that. MRI of the brain in 2006 was fairly classical for MS with multiple white matter lesions. MRI of the cervical and thoracic spine with no MS lesions although some mild degenerative changes were seen. Repeat MRI of the brain in 2008 was unchanged from prior study. She is currently on Avonex without injection site difficulty or reactions. She knows about oral medications but does not see the need to switch therapy. Recent labs  2 weeks ago at Dr. Kenna Gilbert  Office. She continues to complain of intermittent numbness and tingling however this does not interfere with her ability to perform her job or do any of her activities. She denies spasms, focal weakness, sensory changes, visual changes, speech or swallowing problems, no problems with bowel or bladder function.   ROS:  - numbness  Physical Exam General: well developed, well nourished, seated, in no evident distress Head: head normocephalic and atraumatic. Oropharynx benign Neck: supple with no carotid or supraclavicular bruits Cardiovascular: regular rate and rhythm, no murmurs  Neurologic Exam Mental Status: Awake and fully alert. Oriented to place and time. Follows all commands. Speech and language are normal   Cranial Nerves: Fundoscopic exam reveals sharp disc margins. Pupils equal, briskly reactive to light. Extraocular movements full without nystagmus. Visual fields full to confrontation. Hearing intact and symmetric to finger snap. Facial sensation intact. Face, tongue, palate move normally and symmetrically. Neck flexion and extension normal.  Motor: Normal bulk and tone. Normal strength in all tested extremity muscles. No focal weakness Sensory.: intact to touch and pinprick and vibratory in all extremities.  Coordination: Rapid alternating movements  normal in all extremities. Finger-to-nose and heel-to-shin performed accurately bilaterally. Gait and Station: Arises from chair without difficulty. Stance is normal. Gait demonstrates normal stride length and balance . Able to heel, toe and tandem walk without difficulty.  Reflexes: 2+ and symmetric. Toes downgoing.     ASSESSMENT: Multiple sclerosis with no recent relapses, currently on Avonex Last MRI of the brain in 2008 Patient reports recent CBC and CMP at Dr. Kenna Gilbert office      PLAN: Will repeat MRI of the brain and compare it to 2006 and 2008 Continue Avonex weekly Continue exercise and healthy diet for overall health and well-being Followup yearly and when necessary Please have Dr. Loreta Ave send [place] recent labs.   Nilda Riggs, GNP-BC APRN

## 2012-09-20 ENCOUNTER — Other Ambulatory Visit: Payer: Self-pay | Admitting: Neurology

## 2012-09-28 ENCOUNTER — Telehealth: Payer: Self-pay | Admitting: Neurology

## 2012-09-28 NOTE — Telephone Encounter (Signed)
The patient called earlier tonight. She is having difficulty getting her Avonex prescription. [facility] was [facility] but [facility]. The patient will likely need another prescription for Avonex to mail off to the special the pharmacy. She is trying to locate a local pharmacy that carries the medication.

## 2012-10-02 ENCOUNTER — Other Ambulatory Visit: Payer: Self-pay

## 2012-10-02 MED ORDER — INTERFERON BETA-1A 30 MCG/0.5ML IM KIT: 30.0000 ug | PACK | INTRAMUSCULAR | Status: DC

## 2012-10-11 ENCOUNTER — Ambulatory Visit (INDEPENDENT_AMBULATORY_CARE_PROVIDER_SITE_OTHER): Payer: PRIVATE HEALTH INSURANCE

## 2012-10-11 DIAGNOSIS — G35 Multiple sclerosis: Secondary | ICD-10-CM

## 2012-10-12 MED ORDER — GADOPENTETATE DIMEGLUMINE 469.01 MG/ML IV SOLN: 12.0000 mL | Freq: Once | INTRAVENOUS | Status: AC | PRN

## 2012-10-16 NOTE — Progress Notes (Signed)
Quick Note:  Spoke with patient and relayed the results of their MRI. The patient understood and had no questions.  ______

## 2012-11-07 ENCOUNTER — Other Ambulatory Visit: Payer: Self-pay | Admitting: Neurology

## 2013-02-13 ENCOUNTER — Other Ambulatory Visit: Payer: Self-pay

## 2013-02-13 DIAGNOSIS — Z1231 Encounter for screening mammogram for malignant neoplasm of breast: Secondary | ICD-10-CM

## 2013-03-12 ENCOUNTER — Ambulatory Visit: Payer: Self-pay

## 2013-04-08 ENCOUNTER — Ambulatory Visit
Admission: RE | Admit: 2013-04-08 | Discharge: 2013-04-08 | Disposition: A | Discharge status: Home / Self Care | Payer: Self-pay | Source: Ambulatory Visit

## 2013-04-08 DIAGNOSIS — Z1231 Encounter for screening mammogram for malignant neoplasm of breast: Secondary | ICD-10-CM

## 2013-04-09 ENCOUNTER — Other Ambulatory Visit: Payer: Self-pay | Admitting: Family Medicine

## 2013-04-09 DIAGNOSIS — R928 Other abnormal and inconclusive findings on diagnostic imaging of breast: Secondary | ICD-10-CM

## 2013-04-12 ENCOUNTER — Ambulatory Visit
Admission: RE | Admit: 2013-04-12 | Discharge: 2013-04-12 | Disposition: A | Discharge status: Home / Self Care | Payer: PRIVATE HEALTH INSURANCE | Source: Ambulatory Visit | Attending: Family Medicine | Admitting: Family Medicine

## 2013-04-12 DIAGNOSIS — R928 Other abnormal and inconclusive findings on diagnostic imaging of breast: Secondary | ICD-10-CM

## 2013-09-27 ENCOUNTER — Other Ambulatory Visit: Payer: Self-pay | Admitting: Family Medicine

## 2013-09-27 ENCOUNTER — Telehealth: Payer: Self-pay | Admitting: Neurology

## 2013-09-27 DIAGNOSIS — R921 Mammographic calcification found on diagnostic imaging of breast: Secondary | ICD-10-CM

## 2013-09-27 MED ORDER — INTERFERON BETA-1A 30 MCG/0.5ML IM KIT: 30.0000 ug | PACK | INTRAMUSCULAR | Status: DC

## 2013-09-27 NOTE — Telephone Encounter (Signed)
I have not gotten any requests for this patient.  Rx has now been sent.

## 2013-09-27 NOTE — Telephone Encounter (Signed)
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

## 2013-10-09 ENCOUNTER — Encounter: Payer: Self-pay | Admitting: Nurse Practitioner

## 2013-10-09 ENCOUNTER — Ambulatory Visit (INDEPENDENT_AMBULATORY_CARE_PROVIDER_SITE_OTHER): Payer: PRIVATE HEALTH INSURANCE | Admitting: Nurse Practitioner

## 2013-10-09 ENCOUNTER — Encounter (INDEPENDENT_AMBULATORY_CARE_PROVIDER_SITE_OTHER): Payer: Self-pay

## 2013-10-09 VITALS — BP 99/64 | HR 85 | Ht 62.5 in | Wt 136.0 lb

## 2013-10-09 DIAGNOSIS — G35 Multiple sclerosis: Secondary | ICD-10-CM

## 2013-10-09 DIAGNOSIS — G2581 Restless legs syndrome: Secondary | ICD-10-CM | POA: Insufficient documentation

## 2013-10-09 DIAGNOSIS — Z79899 Other long term (current) drug therapy: Secondary | ICD-10-CM

## 2013-10-09 NOTE — Patient Instructions (Signed)
Reviewed recent CBC and CMP Will check iron studies Continue Avonex weekly F/U yearly

## 2013-10-09 NOTE — Progress Notes (Signed)
I have read the note, and I agree with the clinical assessment and plan.  Doreather Hoxworth KEITH   

## 2013-10-09 NOTE — Progress Notes (Signed)
[facility]  PATIENT: [name] DOB: 05-Aug-1955   REASON FOR VISIT: Followup for multiple sclerosis.   HISTORY OF PRESENT ILLNESS: Ms. [name], 58 year old female returns for followup.She has a history of multiple sclerosis and has been followed by our office since June 2006 however symptoms date back 20 years prior to that. MRI of the brain in 2006 was fairly classical for MS with multiple white matter lesions. MRI of the cervical and thoracic spine with no MS lesions although some mild degenerative changes were seen. Repeat MRI of the brain in 2008 was unchanged from prior study. She is currently on Avonex without injection site difficulty or reactions. She knows about oral medications but does not see the need to switch therapy. CBC and CMP reviewed from January 2015  She continues to complain of intermittent numbness and tingling however this does not interfere with her ability to perform her job or do any of her activities. She denies spasms, focal weakness, sensory changes, visual changes, speech or swallowing problems, no problems with bowel or bladder function. She does complain of restless legs at night. She returns for reevaluation     REVIEW OF SYSTEMS: Full 14 system review of systems performed and notable only for those listed, all others are neg:  Constitutional: N/A  Cardiovascular: N/A  Ear/Nose/Throat: N/A  Skin: N/A  Eyes: N/A  Respiratory: N/A  Gastroitestinal: N/A  Hematology/Lymphatic: N/A  Endocrine: N/A Musculoskeletal:N/A  Allergy/Immunology: N/A  Neurological: Numbness Psychiatric: N/A Sleep : Restless legs   ALLERGIES: Allergies  Allergen Reactions  . Sulfa Antibiotics Hives    HOME MEDICATIONS: Outpatient Prescriptions Prior to Visit  Medication Sig Dispense Refill  . buPROPion (WELLBUTRIN XL) 300 MG 24 hr tablet TAKE 1 TABLET BY MOUTH EVERY DAY  90 tablet  3  . ibuprofen (ADVIL,MOTRIN) 200 MG tablet Take 600 mg by mouth  every 6 (six) hours as needed.        . interferon beta-1a (AVONEX PEN) 30 MCG/0.5ML injection Inject 0.5 mLs (30 mcg total) into the muscle every 7 (seven) days.  1 kit  0   No facility-administered medications prior to visit.    PAST MEDICAL HISTORY: Past Medical History  Diagnosis Date  . Multiple sclerosis   . PONV (postoperative nausea and vomiting)     PAST SURGICAL HISTORY: Past Surgical History  Procedure Laterality Date  . Cholecystectomy      FAMILY HISTORY: Family History  Problem Relation Age of Onset  . Diabetes Mother   . Diabetes Father   . Diabetes Sister     SOCIAL HISTORY: History   Social History  . Marital Status: Married    Spouse Name: Shanon Brow     Number of Children: 2  . Years of Education: BA    Occupational History  .     Social History Main Topics  . Smoking status: Never Smoker   . Smokeless tobacco: Never Used  . Alcohol Use: Yes     Comment: socially 3-5 per week   . Drug Use: No  . Sexual Activity: No   Other Topics Concern  . Not on file   Social History Narrative   Patient lives at home with her husband and she has 2 children. 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      PHYSICAL EXAM  Filed Vitals:   10/09/13 1445  BP: 99/64  Pulse: 85  Height: 5' 2.5" (1.588 m)  Weight: 136 lb (61.689 kg)  Body mass index is 24.46 kg/(m^2).  Generalized: Well developed, in no acute distress  Head: normocephalic and atraumatic,. Oropharynx benign  Neck: Supple, no carotid bruits  Cardiac: Regular rate rhythm, no murmur  Musculoskeletal: No deformity   Neurological examination   Mentation: Alert oriented to time, place, history taking. Follows all commands speech and language fluent. International restless leg rating scale equals 17 which is moderate.  Cranial nerve II-XII: Fundoscopic exam reveals sharp disc margins.Pupils were equal round reactive to light extraocular movements were full, visual  field were full on confrontational test. Facial sensation and strength were normal. hearing was intact to finger rubbing bilaterally. Uvula tongue midline. head turning and shoulder shrug were normal and symmetric.Tongue protrusion into cheek strength was normal. Motor: normal bulk and tone, full strength in the BUE, BLE,  No focal weakness Sensory: normal and symmetric to light touch, pinprick, and  vibration  Coordination: finger-nose-finger, heel-to-shin bilaterally, no dysmetria Reflexes: Brachioradialis 2/2, biceps 2/2, triceps 2/2, patellar 2/2, Achilles 2/2, plantar responses were flexor bilaterally. Gait and Station: Rising up from seated position without assistance, normal stance,  moderate stride, good arm swing, smooth turning, able to perform tiptoe, and heel walking without difficulty. Tandem gait is steady  DIAGNOSTIC DATA (LABS, IMAGING, TESTING) MRI of the brain 10/11/2012 was abnormal showing scattered subcortical, periventricular and pericallosal white matter hyperintensities typical for demyelinating disease. No  significant changes compared with previous MRI from 2006.-   ASSESSMENT AND PLAN  58 y.o. year old female  has a past medical history of Multiple sclerosis here for followup. She also is complaining of restless legs which is a new symptom for her.  Reviewed recent CBC and CMP done in January 2015.  Will check iron studies for restless leg complaints. International restless leg rating score equals 17 which is moderate Continue Avonex weekly F/U yearly Dennie Bible, [facility], [facility], APRN  [facility] [address], [place],  [address] [phone]

## 2013-10-10 LAB — IRON AND TIBC
Iron Saturation: 35 % (ref 15–55)
Iron: 106 ug/dL (ref 35–155)
TIBC: 307 ug/dL (ref 250–450)
UIBC: 201 ug/dL (ref 150–375)

## 2013-10-10 LAB — FERRITIN: Ferritin: 154 ng/mL — ABNORMAL HIGH (ref 15–150)

## 2013-10-11 ENCOUNTER — Telehealth: Payer: Self-pay | Admitting: Nurse Practitioner

## 2013-10-11 MED ORDER — PRAMIPEXOLE DIHYDROCHLORIDE 0.125 MG PO TABS: 0.1250 mg | ORAL_TABLET | Freq: Every day | ORAL | Status: DC

## 2013-10-11 NOTE — Telephone Encounter (Signed)
Telephone call to patient. Left message. Iron studies are normal. Begin  low-dose Mirapex to be taken 2 hours before bed. Call for questions. Prescription sent.

## 2013-10-15 ENCOUNTER — Ambulatory Visit
Admission: RE | Admit: 2013-10-15 | Discharge: 2013-10-15 | Disposition: A | Discharge status: Home / Self Care | Payer: PRIVATE HEALTH INSURANCE | Source: Ambulatory Visit | Attending: Family Medicine | Admitting: Family Medicine

## 2013-10-15 DIAGNOSIS — R921 Mammographic calcification found on diagnostic imaging of breast: Secondary | ICD-10-CM

## 2013-10-23 ENCOUNTER — Other Ambulatory Visit: Payer: Self-pay

## 2013-10-23 MED ORDER — INTERFERON BETA-1A 30 MCG/0.5ML IM KIT: 30.0000 ug | PACK | INTRAMUSCULAR | Status: DC

## 2013-11-23 ENCOUNTER — Other Ambulatory Visit: Payer: Self-pay | Admitting: Neurology

## 2013-11-25 NOTE — Telephone Encounter (Signed)
Dr Terrace ArabiaYan has been prescribing this since 2011 per [facility]

## 2014-10-07 ENCOUNTER — Encounter: Payer: Self-pay | Admitting: Nurse Practitioner

## 2014-10-07 ENCOUNTER — Encounter (INDEPENDENT_AMBULATORY_CARE_PROVIDER_SITE_OTHER): Payer: Self-pay

## 2014-10-07 ENCOUNTER — Ambulatory Visit (INDEPENDENT_AMBULATORY_CARE_PROVIDER_SITE_OTHER): Payer: PRIVATE HEALTH INSURANCE | Admitting: Nurse Practitioner

## 2014-10-07 VITALS — BP 114/76 | HR 92 | Ht 63.0 in | Wt 137.0 lb

## 2014-10-07 DIAGNOSIS — Z5181 Encounter for therapeutic drug level monitoring: Secondary | ICD-10-CM | POA: Diagnosis not present

## 2014-10-07 DIAGNOSIS — G35 Multiple sclerosis: Secondary | ICD-10-CM | POA: Diagnosis not present

## 2014-10-07 DIAGNOSIS — G2581 Restless legs syndrome: Secondary | ICD-10-CM | POA: Diagnosis not present

## 2014-10-07 MED ORDER — PRAMIPEXOLE DIHYDROCHLORIDE 0.125 MG PO TABS: ORAL_TABLET | ORAL | Status: DC

## 2014-10-07 NOTE — Patient Instructions (Signed)
Continue Avonex weekly as ordered for multiple sclerosis Will check labs today to monitor adverse effects of Avonex Try Mirapex for restless legs take as directed Continue Yoga  for flexibility and overall fitness Follow-up yearly and when necessary

## 2014-10-07 NOTE — Progress Notes (Signed)
I have read the note, and I agree with the clinical assessment and plan.  [name],[name]   

## 2014-10-07 NOTE — Progress Notes (Signed)
[facility]  PATIENT: [name] DOB: 05-04-1955   REASON FOR VISIT: Follow-up for multiple sclerosis and restless legs  HISTORY FROM: Patient    HISTORY OF PRESENT ILLNESS:[name], 59 year old female returns for followup.She has a history of multiple sclerosis and has been followed by our office since June 2006 however symptoms date back 20 years prior to that. MRI of the brain in 2006 was fairly classical for MS with multiple white matter lesions. MRI of the cervical and thoracic spine with no MS lesions although some mild degenerative changes were seen. Repeat MRI of the brain in 2008 was unchanged from prior study. She is currently on Avonex without injection site difficulty or reactions. She knows about oral medications but does not see the need to switch therapy. No  recent lab work . She continues to complain of intermittent numbness and tingling however this does not interfere with her ability to perform her job or do any of her activities. She denies spasms, focal weakness, sensory changes, visual changes, speech or swallowing problems, no problems with bowel or bladder function. She does complain of restless legs at night. Iron  studies returned normal. She has started yoga for flexibility and stretching. She returns for reevaluation.    REVIEW OF SYSTEMS: Full 14 system review of systems performed and notable only for those listed, all others are neg:  Constitutional: neg  Cardiovascular: neg Ear/Nose/Throat: neg  Skin: neg Eyes: neg Respiratory: neg Gastroitestinal: neg  Hematology/Lymphatic: neg  Endocrine: neg Musculoskeletal:neg Allergy/Immunology: neg Neurological: Occasional numbness  Psychiatric: neg Sleep : Restless legs   ALLERGIES: Allergies  Allergen Reactions  . Sulfa Antibiotics Hives    HOME MEDICATIONS: Outpatient Prescriptions Prior to Visit  Medication Sig Dispense Refill  . buPROPion (WELLBUTRIN XL) 300 MG 24 hr tablet  TAKE 1 TABLET BY MOUTH EVERY DAY 90 tablet 4  . ibuprofen (ADVIL,MOTRIN) 200 MG tablet Take 600 mg by mouth every 6 (six) hours as needed.      . interferon beta-1a (AVONEX PEN) 30 MCG/0.5ML injection Inject 0.5 mLs (30 mcg total) into the muscle every 7 (seven) days. 1 kit 12  . naproxen (NAPROSYN) 500 MG tablet Take 500 mg by mouth as needed. Takes after avonex injections.    . pramipexole (MIRAPEX) 0.125 MG tablet Take 1 tablet (0.125 mg total) by mouth at bedtime. Take 2 hours before bedtime 30 tablet 6   No facility-administered medications prior to visit.    PAST MEDICAL HISTORY: Past Medical History  Diagnosis Date  . Multiple sclerosis   . PONV (postoperative nausea and vomiting)     PAST SURGICAL HISTORY: Past Surgical History  Procedure Laterality Date  . Cholecystectomy      FAMILY HISTORY: Family History  Problem Relation Age of Onset  . Diabetes Mother   . Diabetes Father   . Diabetes Sister     SOCIAL HISTORY: History   Social History  . Marital Status: Married    Spouse Name: Shanon Brow   . Number of Children: 2  . Years of Education: BA    Occupational History  .     Social History Main Topics  . Smoking status: Never Smoker   . Smokeless tobacco: Never Used  . Alcohol Use: Yes     Comment: socially 3-5 per week   . Drug Use: No  . Sexual Activity: No   Other Topics Concern  . Not on file   Social History Narrative   Patient lives at home  with her husband and she has 2 children. 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      PHYSICAL EXAM  Filed Vitals:   10/07/14 1329  BP: 114/76  Pulse: 92  Height: _0  (1.6 m)  Weight: 137 lb (62.143 kg)   Body mass index is 24.27 kg/(m^2). Generalized: Well developed, in no acute distress  Head: normocephalic and atraumatic,. Oropharynx benign  Neck: Supple, no carotid bruits  Cardiac: Regular rate rhythm, no murmur  Musculoskeletal: No deformity   Neurological  examination   Mentation: Alert oriented to time, place, history taking. Follows all commands speech and language fluent.   Cranial nerve II-XII: Fundoscopic exam reveals sharp disc margins.Pupils were equal round reactive to light extraocular movements were full, visual field were full on confrontational test. Facial sensation and strength were normal. hearing was intact to finger rubbing bilaterally. Uvula tongue midline. head turning and shoulder shrug were normal and symmetric.Tongue protrusion into cheek strength was normal. Motor: normal bulk and tone, full strength in the BUE, BLE, No focal weakness Sensory: normal and symmetric to light touch, pinprick, and vibration  Coordination: finger-nose-finger, heel-to-shin bilaterally, no dysmetria Reflexes: Brachioradialis 2/2, biceps 2/2, triceps 2/2, patellar 2/2, Achilles 2/2, plantar responses were flexor bilaterally. Gait and Station: Rising up from seated position without assistance, normal stance, moderate stride, good arm swing, smooth turning, able to perform tiptoe, and heel walking without difficulty. Tandem gait is steady   DIAGNOSTIC DATA (LABS, IMAGING, TESTING) - ASSESSMENT AND PLAN  59 y.o. year old female  has a past medical history of Multiple sclerosis and restless legs here to follow up.   Continue Avonex weekly as ordered for multiple sclerosis Will check labs today to monitor adverse effects of Avonex Try Mirapex for restless legs take as directed Continue Yoga  for flexibility and overall fitness Follow-up yearly and when necessary Dennie Bible, [facility], [facility], APRN  [facility] [address] [place], [place] [address] [phone]

## 2014-10-08 LAB — COMPREHENSIVE METABOLIC PANEL
A/G RATIO: 2 (ref 1.1–2.5)
ALK PHOS: 69 IU/L (ref 39–117)
ALT: 21 IU/L (ref 0–32)
AST: 18 IU/L (ref 0–40)
Albumin: 4.5 g/dL (ref 3.5–5.5)
BUN / CREAT RATIO: 22 (ref 9–23)
BUN: 17 mg/dL (ref 6–24)
CALCIUM: 9.9 mg/dL (ref 8.7–10.2)
CHLORIDE: 98 mmol/L (ref 97–108)
CO2: 27 mmol/L (ref 18–29)
Creatinine, Ser: 0.79 mg/dL (ref 0.57–1.00)
GFR calc Af Amer: 95 mL/min/{1.73_m2} (ref >59)
GFR calc non Af Amer: 83 mL/min/{1.73_m2} (ref >59)
GLUCOSE: 118 mg/dL — AB (ref 65–99)
Globulin, Total: 2.2 g/dL (ref 1.5–4.5)
Potassium: 4.6 mmol/L (ref 3.5–5.2)
SODIUM: 140 mmol/L (ref 134–144)
Total Protein: 6.7 g/dL (ref 6.0–8.5)

## 2014-10-08 LAB — CBC WITH DIFFERENTIAL/PLATELET
BASOS: 1 %
Basophils Absolute: 0 10*3/uL (ref 0.0–0.2)
EOS (ABSOLUTE): 0.2 10*3/uL (ref 0.0–0.4)
Eos: 2 %
HEMATOCRIT: 39.9 % (ref 34.0–46.6)
Hemoglobin: 13 g/dL (ref 11.1–15.9)
Immature Grans (Abs): 0 10*3/uL (ref 0.0–0.1)
Immature Granulocytes: 0 %
LYMPHS ABS: 2.2 10*3/uL (ref 0.7–3.1)
LYMPHS: 35 %
MCH: 29.7 pg (ref 26.6–33.0)
MCHC: 32.6 g/dL (ref 31.5–35.7)
MCV: 91 fL (ref 79–97)
Monocytes Absolute: 0.6 10*3/uL (ref 0.1–0.9)
Monocytes: 9 %
NEUTROS ABS: 3.4 10*3/uL (ref 1.4–7.0)
Neutrophils: 53 %
PLATELETS: 246 10*3/uL (ref 150–379)
RBC: 4.38 x10E6/uL (ref 3.77–5.28)
RDW: 13 % (ref 12.3–15.4)
WBC: 6.2 10*3/uL (ref 3.4–10.8)

## 2014-10-08 NOTE — Progress Notes (Signed)
Quick Note:  I called and LMVM cell # that lab results normal. ______

## 2014-10-17 ENCOUNTER — Other Ambulatory Visit: Payer: Self-pay | Admitting: Neurology

## 2014-10-29 ENCOUNTER — Other Ambulatory Visit: Payer: Self-pay

## 2014-10-29 DIAGNOSIS — Z1231 Encounter for screening mammogram for malignant neoplasm of breast: Secondary | ICD-10-CM

## 2014-10-29 DIAGNOSIS — Z803 Family history of malignant neoplasm of breast: Secondary | ICD-10-CM

## 2014-12-01 ENCOUNTER — Ambulatory Visit
Admission: RE | Admit: 2014-12-01 | Discharge: 2014-12-01 | Disposition: A | Discharge status: Home / Self Care | Payer: PRIVATE HEALTH INSURANCE | Source: Ambulatory Visit

## 2014-12-01 DIAGNOSIS — Z803 Family history of malignant neoplasm of breast: Secondary | ICD-10-CM

## 2014-12-01 DIAGNOSIS — Z1231 Encounter for screening mammogram for malignant neoplasm of breast: Secondary | ICD-10-CM

## 2014-12-02 ENCOUNTER — Other Ambulatory Visit: Payer: Self-pay | Admitting: Neurology

## 2014-12-02 NOTE — Telephone Encounter (Signed)
Expand All Collapse All   Dr Terrace Arabia has been prescribing this since 2011 per [facility]

## 2014-12-08 ENCOUNTER — Other Ambulatory Visit: Payer: Self-pay | Admitting: Neurology

## 2014-12-09 NOTE — Telephone Encounter (Signed)
Spoke with pharmacist who verified they did get the Rx from 08/02.  Asked that we disregard this request.

## 2015-05-26 ENCOUNTER — Other Ambulatory Visit: Payer: Self-pay | Admitting: Family Medicine

## 2015-05-26 ENCOUNTER — Other Ambulatory Visit (HOSPITAL_COMMUNITY)
Admission: RE | Admit: 2015-05-26 | Discharge: 2015-05-26 | Disposition: A | Discharge status: Home / Self Care | Payer: PRIVATE HEALTH INSURANCE | Source: Ambulatory Visit | Attending: Family Medicine | Admitting: Family Medicine

## 2015-05-26 DIAGNOSIS — Z124 Encounter for screening for malignant neoplasm of cervix: Secondary | ICD-10-CM | POA: Insufficient documentation

## 2015-05-29 LAB — CYTOLOGY - PAP

## 2015-10-13 ENCOUNTER — Encounter: Payer: Self-pay | Admitting: Nurse Practitioner

## 2015-10-13 ENCOUNTER — Ambulatory Visit (INDEPENDENT_AMBULATORY_CARE_PROVIDER_SITE_OTHER): Payer: PRIVATE HEALTH INSURANCE | Admitting: Nurse Practitioner

## 2015-10-13 VITALS — BP 110/72 | HR 76 | Ht 63.0 in | Wt 137.4 lb

## 2015-10-13 DIAGNOSIS — G2581 Restless legs syndrome: Secondary | ICD-10-CM | POA: Diagnosis not present

## 2015-10-13 DIAGNOSIS — G35 Multiple sclerosis: Secondary | ICD-10-CM | POA: Diagnosis not present

## 2015-10-13 DIAGNOSIS — Z5181 Encounter for therapeutic drug level monitoring: Secondary | ICD-10-CM

## 2015-10-13 MED ORDER — INTERFERON BETA-1A 30 MCG/0.5ML IM AJKT: 0.5000 mL | AUTO-INJECTOR | INTRAMUSCULAR | Status: DC

## 2015-10-13 NOTE — Patient Instructions (Signed)
Continue Avonex weekly as ordered for multiple sclerosis Will sign to obtain recent labs to monitor adverse effects of Avonex Continue Yoga for flexibility and overall fitness Given information on cooling vest Follow-up yearly and when necessary

## 2015-10-13 NOTE — Progress Notes (Signed)
[facility]  PATIENT: [name] DOB: [birth year]   REASON FOR VISIT: Follow-up for multiple sclerosis restless legs HISTORY FROM: Patient    HISTORY OF PRESENT ILLNESS:[name], 60 year old female returns for followup.She has a history of multiple sclerosis and has been followed by our office since June 2006 however symptoms date back 20 years prior to that. MRI of the brain in 2006 was fairly classical for MS with multiple white matter lesions. MRI of the cervical and thoracic spine with no MS lesions although some mild degenerative changes were seen. Repeat MRI of the brain in 2008 was unchanged from prior study. She is currently on Avonex without injection site difficulty or reactions. She knows about oral medications but does not see the need to switch therapy. Recent labs at PCP.  She continues to complain of intermittent numbness and tingling however this does not interfere with her ability to perform her job or do any of her activities. She denies spasms, focal weakness, sensory changes, visual changes, speech or swallowing problems, no problems with bowel or bladder function. She does complain of restless legs at night. Iron studies returned normal. She has started yoga for flexibility and stretching. She returns for reevaluation   REVIEW OF SYSTEMS: Full 14 system review of systems performed and notable only for those listed, all others are neg:  Constitutional: neg  Cardiovascular: neg Ear/Nose/Throat: neg  Skin: neg Eyes: neg Respiratory: neg Gastroitestinal: neg  Hematology/Lymphatic: neg  Endocrine: neg Musculoskeletal:neg Allergy/Immunology: neg Neurological: Numbness Psychiatric: neg Sleep : neg   ALLERGIES: Allergies  Allergen Reactions  . Sulfa Antibiotics Hives    HOME MEDICATIONS: Outpatient Prescriptions Prior to Visit  Medication Sig Dispense Refill  . AVONEX PEN 30 MCG/0.5ML AJKT INJECT 0.5 MLS (30 MCG TOTAL) INTO THE MUSCLE  EVERY 7 (SEVEN) DAYS. 1 each 12  . buPROPion (WELLBUTRIN XL) 300 MG 24 hr tablet TAKE 1 TABLET BY MOUTH EVERY DAY 90 tablet 4  . ibuprofen (ADVIL,MOTRIN) 200 MG tablet Take 600 mg by mouth every 6 (six) hours as needed.      . pramipexole (MIRAPEX) 0.125 MG tablet 1 po bedtime for 2 weeks then increase to 2 tabs 60 tablet 3   No facility-administered medications prior to visit.    PAST MEDICAL HISTORY: Past Medical History  Diagnosis Date  . Multiple sclerosis (HCC)   . PONV (postoperative nausea and vomiting)     PAST SURGICAL HISTORY: Past Surgical History  Procedure Laterality Date  . Cholecystectomy      FAMILY HISTORY: Family History  Problem Relation Age of Onset  . Diabetes Mother   . Diabetes Father   . Diabetes Sister     SOCIAL HISTORY: Social History   Social History  . Marital Status: Married    Spouse Name: [name]   . Number of Children: 2  . Years of Education: BA    Occupational History  .     Social History Main Topics  . Smoking status: Never Smoker   . Smokeless tobacco: Never Used  . Alcohol Use: Yes     Comment: socially 3-5 per week   . Drug Use: No  . Sexual Activity: No   Other Topics Concern  . Not on file   Social History Narrative   Patient lives at home with her husband and she has 2 children. 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      PHYSICAL EXAM  Filed Vitals:   10/13/15 1301  Height:  (1.6 m)  Weight: 137 lb 6.4 oz (62.324 kg)   Body mass index is 24.35 kg/(m^2). Generalized: Well developed, in no acute distress  Head: normocephalic and atraumatic,. Oropharynx benign  Neck: Supple, no carotid bruits  Cardiac: Regular rate rhythm, no murmur  Musculoskeletal: No deformity   Neurological examination   Mentation: Alert oriented to time, place, history taking. Follows all commands speech and language fluent.   Cranial nerve II-XII: Fundoscopic exam reveals sharp disc  margins.Pupils were equal round reactive to light extraocular movements were full, visual field were full on confrontational test. Facial sensation and strength were normal. hearing was intact to finger rubbing bilaterally. Uvula tongue midline. head turning and shoulder shrug were normal and symmetric.Tongue protrusion into cheek strength was normal. Motor: normal bulk and tone, full strength in the BUE, BLE, No focal weakness Sensory: normal and symmetric to light touch, pinprick, and vibration  Coordination: finger-nose-finger, heel-to-shin bilaterally, no dysmetria Reflexes: Brachioradialis 2/2, biceps 2/2, triceps 2/2, patellar 2/2, Achilles 2/2, plantar responses were flexor bilaterally. Gait and Station: Rising up from seated position without assistance, normal stance, moderate stride, good arm swing, smooth turning, able to perform tiptoe, and heel walking without difficulty. Tandem gait is steady DIAGNOSTIC DATA (LABS, IMAGING, TESTING) -  ASSESSMENT AND PLAN 60 y.o. year old female has a past medical history of Multiple sclerosis and restless legs here to follow up. She has not had exacerbation of symptoms in several years.The patient is a current patient of Dr. Anne Hahn  who is out of the office today . This note is sent to the work in doctor.     Continue Avonex weekly as ordered for multiple sclerosis Will sign to obtain recent labs to monitor adverse effects of Avonex Continue Yoga for flexibility and overall fitness Given information on cooling vest  Follow-up yearly and when necessary Nilda Riggs, [facility], [facility], APRN  [facility] [address], [address] [place], [place] [address] [phone]

## 2015-10-14 ENCOUNTER — Telehealth: Payer: Self-pay | Admitting: *Deleted

## 2015-10-14 NOTE — Telephone Encounter (Signed)
Received lab results from [facility] physicians [id number]. Forwarded to CM/NP.

## 2015-10-14 NOTE — Telephone Encounter (Signed)
Reviewed CBC and CMP which are within normal limits

## 2015-10-20 NOTE — Progress Notes (Signed)
I have reviewed and agreed above plan. 

## 2015-12-24 ENCOUNTER — Other Ambulatory Visit: Payer: Self-pay | Admitting: Family Medicine

## 2015-12-24 DIAGNOSIS — Z1231 Encounter for screening mammogram for malignant neoplasm of breast: Secondary | ICD-10-CM

## 2015-12-29 ENCOUNTER — Other Ambulatory Visit: Payer: Self-pay | Admitting: Neurology

## 2016-01-06 ENCOUNTER — Ambulatory Visit
Admission: RE | Admit: 2016-01-06 | Discharge: 2016-01-06 | Disposition: A | Discharge status: Home / Self Care | Payer: PRIVATE HEALTH INSURANCE | Source: Ambulatory Visit | Attending: Family Medicine | Admitting: Family Medicine

## 2016-01-06 DIAGNOSIS — Z1231 Encounter for screening mammogram for malignant neoplasm of breast: Secondary | ICD-10-CM

## 2016-07-14 ENCOUNTER — Ambulatory Visit (INDEPENDENT_AMBULATORY_CARE_PROVIDER_SITE_OTHER): Payer: PRIVATE HEALTH INSURANCE | Admitting: Psychology

## 2016-07-14 DIAGNOSIS — F4320 Adjustment disorder, unspecified: Secondary | ICD-10-CM | POA: Diagnosis not present

## 2016-10-11 ENCOUNTER — Other Ambulatory Visit: Payer: Self-pay | Admitting: Nurse Practitioner

## 2016-10-12 ENCOUNTER — Ambulatory Visit (INDEPENDENT_AMBULATORY_CARE_PROVIDER_SITE_OTHER): Payer: PRIVATE HEALTH INSURANCE | Admitting: Nurse Practitioner

## 2016-10-12 ENCOUNTER — Encounter: Payer: Self-pay | Admitting: Nurse Practitioner

## 2016-10-12 VITALS — BP 111/71 | HR 84 | Wt 143.0 lb

## 2016-10-12 DIAGNOSIS — Z5181 Encounter for therapeutic drug level monitoring: Secondary | ICD-10-CM | POA: Diagnosis not present

## 2016-10-12 DIAGNOSIS — G35 Multiple sclerosis: Secondary | ICD-10-CM

## 2016-10-12 MED ORDER — INTERFERON BETA-1A 30 MCG/0.5ML IM AJKT: 0.5000 mL | AUTO-INJECTOR | INTRAMUSCULAR | 11 refills | Status: DC

## 2016-10-12 NOTE — Progress Notes (Signed)
[facility]  PATIENT: [name] DOB: 25-Feb-1956   REASON FOR VISIT: Follow-up for multiple sclerosis restless legs HISTORY FROM: Patient    HISTORY OF PRESENT ILLNESS:Ms. [name], 61 year old female returns for followup.She has a history of multiple sclerosis and has been followed by our office since June 2006 however symptoms date back 20 years prior to that. MRI of the brain in 2006 was fairly classical for MS with multiple white matter lesions. MRI of the cervical and thoracic spine with no MS lesions although some mild degenerative changes were seen. Repeat MRI of the brain in 2008 was unchanged from prior study. Last MRI the [facility] in 2014 without change from previous. She is currently on Avonex without injection site difficulty or reactions. She knows about oral medications but does not see the need to switch therapy.  She continues to complain of intermittent numbness and tingling however this does not interfere with her ability to perform her job or do any of her activities. She denies spasms, focal weakness, sensory changes, visual changes, speech or swallowing problems, no problems with bowel or bladder function. She does complain of restless legs at night. Iron studies returned normal. She has recently started having nosebleeds. She is due to see an ENT next week. She has started yoga for flexibility and stretching. She returns for reevaluation   REVIEW OF SYSTEMS: Full 14 system review of systems performed and notable only for those listed, all others are neg:  Constitutional: neg  Cardiovascular: neg Ear/Nose/Throat: Nose bleeds, ENT referral Skin: neg Eyes: neg Respiratory: neg Gastroitestinal: neg  Hematology/Lymphatic: neg  Endocrine: neg Musculoskeletal: Joint pain, arthritis Allergy/Immunology: neg Neurological: Numbness Psychiatric: neg Sleep : neg   ALLERGIES: Allergies  Allergen Reactions  . Sulfa Antibiotics Hives    HOME  MEDICATIONS: Outpatient Medications Prior to Visit  Medication Sig Dispense Refill  . buPROPion (WELLBUTRIN XL) 300 MG 24 hr tablet TAKE 1 TABLET BY MOUTH EVERY DAY 90 tablet 3  . ibuprofen (ADVIL,MOTRIN) 200 MG tablet Take 600 mg by mouth every 6 (six) hours as needed.      . Interferon Beta-1a (AVONEX PEN) 30 MCG/0.5ML AJKT Inject 0.5 mLs into the muscle once a week. 1 each 11  . Melatonin 3 MG TABS Take 1.5 tablets by mouth as needed.     No facility-administered medications prior to visit.     PAST MEDICAL HISTORY: Past Medical History:  Diagnosis Date  . Multiple sclerosis (HCC)   . PONV (postoperative nausea and vomiting)     PAST SURGICAL HISTORY: Past Surgical History:  Procedure Laterality Date  . CHOLECYSTECTOMY      FAMILY HISTORY: Family History  Problem Relation Age of Onset  . Diabetes Mother   . Diabetes Father   . Diabetes Sister     SOCIAL HISTORY: Social History   Social History  . Marital status: Married    Spouse name: Onalee Hua   . Number of children: 2  . Years of education: BA    Occupational History  .  [employer]   Social History Main Topics  . Smoking status: Never Smoker  . Smokeless tobacco: Never Used  . Alcohol use Yes     Comment: socially 3-5 per week   . Drug use: No  . Sexual activity: No   Other Topics Concern  . Not on file   Social History Narrative   Patient lives at home with her husband and she has 2 children. 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      PHYSICAL EXAM  Vitals:   10/12/16 1313  BP: 111/71  Pulse: 84  Weight: 143 lb (64.9 kg)   Body mass index is 25.33 kg/m. Generalized: Well developed, in no acute distress  Head: normocephalic and atraumatic,. Oropharynx benign  Neck: Supple, no carotid bruits  Cardiac: Regular rate rhythm, no murmur  Musculoskeletal: No deformity   Neurological examination   Mentation: Alert oriented to time, place, history  taking. Follows all commands speech and language fluent.   Cranial nerve II-XII: Fundoscopic exam reveals sharp disc margins.Pupils were equal round reactive to light extraocular movements were full, visual field were full on confrontational test. Facial sensation and strength were normal. hearing was intact to finger rubbing bilaterally. Uvula tongue midline. head turning and shoulder shrug were normal and symmetric.Tongue protrusion into cheek strength was normal. Motor: normal bulk and tone, full strength in the BUE, BLE, No focal weakness Sensory: normal and symmetric to light touch, pinprick, and vibration in the upper and lower extremities Coordination: finger-nose-finger, heel-to-shin bilaterally, no dysmetria, no tremor Reflexes: Symmetric upper and lower, plantar responses were flexor bilaterally. Gait and Station: Rising up from seated position without assistance, normal stance, moderate stride, good arm swing, smooth turning, able to perform tiptoe, and heel walking without difficulty. Tandem gait is steady DIAGNOSTIC DATA (LABS, IMAGING, TESTING) -  ASSESSMENT AND PLAN 61 y.o. year old female has a past medical history of Multiple sclerosis and restless legs here to follow up. She has not had exacerbation of symptoms in several years.Last MRI 2014.     Continue Avonex weekly as ordered for multiple sclerosis Continue Yoga for flexibility and overall fitness MRI of the brain with and without to follow progression of MS CMP , CBC to monitor adverse effects of Avonex Follow-up yearly and when necessary Nilda Riggs, [facility], [facility], APRN  [facility] [address], [address] [place], [place] [address] [phone]

## 2016-10-12 NOTE — Progress Notes (Signed)
I have read the note, and I agree with the clinical assessment and plan.  [name],CHARLES KEITH   

## 2016-10-12 NOTE — Patient Instructions (Signed)
Continue Avonex weekly as ordered for multiple sclerosis Continue Yoga for flexibility and overall fitness MRI of the brain with and without CMP  Follow-up yearly and when necessary

## 2016-10-13 ENCOUNTER — Telehealth: Payer: Self-pay | Admitting: *Deleted

## 2016-10-13 LAB — COMPREHENSIVE METABOLIC PANEL
ALT: 20 IU/L (ref 0–32)
AST: 20 IU/L (ref 0–40)
Albumin/Globulin Ratio: 2 (ref 1.2–2.2)
Albumin: 4.6 g/dL (ref 3.6–4.8)
Alkaline Phosphatase: 59 IU/L (ref 39–117)
BILIRUBIN TOTAL: 0.2 mg/dL (ref 0.0–1.2)
BUN/Creatinine Ratio: 18 (ref 12–28)
BUN: 14 mg/dL (ref 8–27)
CALCIUM: 9.4 mg/dL (ref 8.7–10.3)
CHLORIDE: 99 mmol/L (ref 96–106)
CO2: 27 mmol/L (ref 20–29)
CREATININE: 0.78 mg/dL (ref 0.57–1.00)
GFR calc Af Amer: 96 mL/min/{1.73_m2} (ref >59)
GFR calc non Af Amer: 83 mL/min/{1.73_m2} (ref >59)
GLUCOSE: 107 mg/dL — AB (ref 65–99)
Globulin, Total: 2.3 g/dL (ref 1.5–4.5)
Potassium: 4.6 mmol/L (ref 3.5–5.2)
Sodium: 140 mmol/L (ref 134–144)
Total Protein: 6.9 g/dL (ref 6.0–8.5)

## 2016-10-13 NOTE — Telephone Encounter (Signed)
Spoke to pt and relayed that her lab results looked good per CM/NP.  She is awaiting her MRI to be authorized then scheduled.  She verbalized understanding of her lab results.

## 2016-10-13 NOTE — Telephone Encounter (Signed)
-----   Message from Nilda Riggs, NP sent at 10/13/2016  9:13 AM EDT ----- Labs look good please call the patient

## 2016-10-31 ENCOUNTER — Ambulatory Visit
Admission: RE | Admit: 2016-10-31 | Discharge: 2016-10-31 | Disposition: A | Discharge status: Home / Self Care | Payer: PRIVATE HEALTH INSURANCE | Source: Ambulatory Visit | Attending: Nurse Practitioner | Admitting: Nurse Practitioner

## 2016-10-31 DIAGNOSIS — Z5181 Encounter for therapeutic drug level monitoring: Secondary | ICD-10-CM

## 2016-10-31 DIAGNOSIS — G35 Multiple sclerosis: Secondary | ICD-10-CM

## 2016-10-31 MED ORDER — GADOBENATE DIMEGLUMINE 529 MG/ML IV SOLN
12.0000 mL | Freq: Once | INTRAVENOUS | Status: AC | PRN
  Administered 2016-10-31: 12 mL via INTRAVENOUS

## 2016-11-01 ENCOUNTER — Telehealth: Payer: Self-pay | Admitting: *Deleted

## 2016-11-01 NOTE — Telephone Encounter (Signed)
Spoke with patient and informed her that her MRI brain result is stable, no new findings. She verbalized understanding, appreciation for call.

## 2016-12-21 ENCOUNTER — Other Ambulatory Visit: Payer: Self-pay | Admitting: Neurology

## 2016-12-27 ENCOUNTER — Ambulatory Visit (INDEPENDENT_AMBULATORY_CARE_PROVIDER_SITE_OTHER): Payer: PRIVATE HEALTH INSURANCE | Admitting: Psychology

## 2016-12-27 DIAGNOSIS — F4320 Adjustment disorder, unspecified: Secondary | ICD-10-CM | POA: Diagnosis not present

## 2017-01-08 ENCOUNTER — Other Ambulatory Visit: Payer: Self-pay | Admitting: Neurology

## 2017-01-26 ENCOUNTER — Ambulatory Visit: Payer: Self-pay | Admitting: Psychology

## 2017-02-21 ENCOUNTER — Other Ambulatory Visit: Payer: Self-pay | Admitting: Family Medicine

## 2017-02-21 DIAGNOSIS — Z1231 Encounter for screening mammogram for malignant neoplasm of breast: Secondary | ICD-10-CM

## 2017-03-13 ENCOUNTER — Ambulatory Visit
Admission: RE | Admit: 2017-03-13 | Discharge: 2017-03-13 | Disposition: A | Discharge status: Home / Self Care | Payer: PRIVATE HEALTH INSURANCE | Source: Ambulatory Visit | Attending: Family Medicine | Admitting: Family Medicine

## 2017-03-13 DIAGNOSIS — Z1231 Encounter for screening mammogram for malignant neoplasm of breast: Secondary | ICD-10-CM

## 2017-06-09 ENCOUNTER — Encounter: Payer: Self-pay | Admitting: Nurse Practitioner

## 2017-07-07 ENCOUNTER — Telehealth: Payer: Self-pay | Admitting: Nurse Practitioner

## 2017-07-07 MED ORDER — INTERFERON BETA-1A 30 MCG/0.5ML IM AJKT: 0.5000 mL | AUTO-INJECTOR | INTRAMUSCULAR | 11 refills | Status: DC

## 2017-07-07 NOTE — Telephone Encounter (Signed)
Done

## 2017-07-07 NOTE — Telephone Encounter (Signed)
Christ[facility] @ [facility] has called for the prescription to be sent for the Interferon Beta-1a (AVONEX PEN) 30 MCG/0.5ML AJKT  Their phone is [phone] Fax#[phone]

## 2017-07-07 NOTE — Addendum Note (Signed)
Addended by: Guy Begin on: 07/07/2017 12:48 PM   Modules accepted: Orders

## 2017-07-19 ENCOUNTER — Telehealth: Payer: Self-pay | Admitting: *Deleted

## 2017-07-19 NOTE — Telephone Encounter (Signed)
Called patient. She was on wait list to come in sooner for appt if there was a cancellation. She was scheduled 01/09/18 from 10/16/17 since provider was going to be out. She requested appt sometime after 11/20/17 d/t grandchild due sometime in June. Made appt for 11/21/17 at 730am with Dr. Anne Hahn. Pt verbalized understanding and appreciation for call. I cx appt that was on 01/09/18.

## 2017-08-22 ENCOUNTER — Other Ambulatory Visit: Payer: Self-pay | Admitting: Family Medicine

## 2017-08-22 ENCOUNTER — Other Ambulatory Visit (HOSPITAL_COMMUNITY)
Admission: RE | Admit: 2017-08-22 | Discharge: 2017-08-22 | Disposition: A | Discharge status: Home / Self Care | Payer: PRIVATE HEALTH INSURANCE | Source: Ambulatory Visit | Attending: Family Medicine | Admitting: Family Medicine

## 2017-08-22 DIAGNOSIS — Z124 Encounter for screening for malignant neoplasm of cervix: Secondary | ICD-10-CM | POA: Insufficient documentation

## 2017-08-24 LAB — CYTOLOGY - PAP
DIAGNOSIS: NEGATIVE
HPV: NOT DETECTED

## 2017-10-07 ENCOUNTER — Other Ambulatory Visit: Payer: Self-pay | Admitting: Nurse Practitioner

## 2017-10-16 ENCOUNTER — Ambulatory Visit: Payer: PRIVATE HEALTH INSURANCE | Admitting: Nurse Practitioner

## 2017-11-21 ENCOUNTER — Encounter: Payer: Self-pay | Admitting: Neurology

## 2017-11-21 ENCOUNTER — Ambulatory Visit (INDEPENDENT_AMBULATORY_CARE_PROVIDER_SITE_OTHER): Payer: PRIVATE HEALTH INSURANCE | Admitting: Neurology

## 2017-11-21 VITALS — BP 125/73 | HR 89 | Ht 63.0 in | Wt 139.0 lb

## 2017-11-21 DIAGNOSIS — G35 Multiple sclerosis: Secondary | ICD-10-CM

## 2017-11-21 NOTE — Progress Notes (Signed)
Reason for visit: Multiple sclerosis  [name] is an 62 y.o. female  History of present illness:  [name] is a 62 year old right-handed white female with a history of multiple sclerosis.  The patient was diagnosed 33 years ago, she has been relatively stable on Avonex for many years.  The patient has had MRI evaluation of the brain done 1 year ago, this shows no progression of her lesions, the patient has findings very typical on MRI for multiple sclerosis.  The patient reports no significant side effects on the Avonex, she gets annual blood work done through her primary care physician, her last blood work was done in April 2019.  The patient denies any significant fatigue, she denies any new numbness or weakness of the face, arms, legs.  She denies any visual changes.  She does get an eye examination once a year.  The patient does report some persistent sensory symptoms that worsen with fatigue and with heat exposure.  The patient reports some arthritic symptoms, no other new complaints.  She returns for an evaluation.  Past Medical History:  Diagnosis Date  . Multiple sclerosis (HCC)   . PONV (postoperative nausea and vomiting)     Past Surgical History:  Procedure Laterality Date  . CHOLECYSTECTOMY      Family History  Problem Relation Age of Onset  . Diabetes Mother   . Breast cancer Mother   . Diabetes Father   . Diabetes Sister     Social history:  reports that she has never smoked. She has never used smokeless tobacco. She reports that she drinks alcohol. She reports that she does not use drugs.    Allergies  Allergen Reactions  . Sulfa Antibiotics Hives    Medications:  Prior to Admission medications   Medication Sig Start Date End Date Taking? Authorizing Provider  buPROPion (WELLBUTRIN XL) 300 MG 24 hr tablet TAKE 1 TABLET BY MOUTH EVERY DAY 10/09/17  Yes York Spaniel, MD  ibuprofen (ADVIL,MOTRIN) 200 MG tablet Take 600 mg by mouth every 6 (six)  hours as needed.     Yes [provider]  Interferon Beta-1a (AVONEX PEN) 30 MCG/0.5ML AJKT Inject 0.5 mLs into the muscle once a week. 07/07/17  Yes Nilda Riggs, NP  Melatonin 3 MG TABS Take 1.5 tablets by mouth as needed.   Yes [provider]    ROS:  Out of a complete 14 system review of symptoms, the patient complains only of the following symptoms, and all other reviewed systems are negative.  Joint pain Paresthesias  Blood pressure 125/73, pulse 89, height 5\' 3"  (1.6 m), weight 139 lb (63 kg).  Physical Exam  General: The patient is alert and cooperative at the time of the examination.  Skin: No significant peripheral edema is noted.   Neurologic Exam  Mental status: The patient is alert and oriented x 3 at the time of the examination. The patient has apparent normal recent and remote memory, with an apparently normal attention span and concentration ability.   Cranial nerves: Facial symmetry is present. Speech is normal, no aphasia or dysarthria is noted. Extraocular movements are full. Visual fields are full.  Pupils are equal, round, and reactive to light.  Discs are flat bilaterally.  Motor: The patient has good strength in all 4 extremities.  Sensory examination: Soft touch sensation is symmetric on the face, arms, and legs.  Coordination: The patient has good finger-nose-finger and heel-to-shin bilaterally.  Gait and station:  The patient has a normal gait. Tandem gait is normal. Romberg is negative. No drift is seen.  Reflexes: Deep tendon reflexes are symmetric.   Assessment/Plan:  1.  Multiple sclerosis  The patient has done quite well with Avonex, she is tolerating medication well.  She will continue on this drug, she will follow-up in 1 year.  She gets annual blood work done through her primary care physician.  Marlan Palau MD 11/21/2017 7:42 AM  [facility] [address] [place], [place]  [address]  Phone [phone] Fax [phone]

## 2018-01-09 ENCOUNTER — Encounter

## 2018-01-09 ENCOUNTER — Ambulatory Visit: Payer: PRIVATE HEALTH INSURANCE | Admitting: Neurology

## 2018-05-24 ENCOUNTER — Other Ambulatory Visit: Payer: Self-pay | Admitting: Family Medicine

## 2018-05-24 DIAGNOSIS — Z1231 Encounter for screening mammogram for malignant neoplasm of breast: Secondary | ICD-10-CM

## 2018-06-19 ENCOUNTER — Ambulatory Visit
Admission: RE | Admit: 2018-06-19 | Discharge: 2018-06-19 | Disposition: A | Discharge status: Home / Self Care | Payer: PRIVATE HEALTH INSURANCE | Source: Ambulatory Visit | Attending: Family Medicine | Admitting: Family Medicine

## 2018-06-19 ENCOUNTER — Other Ambulatory Visit: Payer: Self-pay | Admitting: *Deleted

## 2018-06-19 DIAGNOSIS — Z1231 Encounter for screening mammogram for malignant neoplasm of breast: Secondary | ICD-10-CM

## 2018-06-19 MED ORDER — INTERFERON BETA-1A 30 MCG/0.5ML IM AJKT: 0.5000 mL | AUTO-INJECTOR | INTRAMUSCULAR | 11 refills | Status: DC

## 2018-06-19 NOTE — Telephone Encounter (Signed)
Received refill request from [facility] pharmacy for avonex.

## 2018-10-15 ENCOUNTER — Other Ambulatory Visit: Payer: Self-pay | Admitting: Neurology

## 2018-11-26 NOTE — Progress Notes (Signed)
PATIENT: [name] DOB: 06/13/1955  REASON FOR VISIT: follow up HISTORY FROM: patient  HISTORY OF PRESENT ILLNESS: Today 11/27/18  [name] is a 63 year old female with history of multiple sclerosis.  She has been relatively stable on Avonex for many years.  She had MRI of the brain in July 2018, was stable, no new findings.  She does report some persistent right sided sensory symptoms that worsen with fatigue or heat exposure.  She reports she has been tolerating Avonex well.  She does say after the last several injections she has had a headache the day after.  This could be attributed to stress or heat.  She says her overall health is good.  She denies any new numbness or weakness.  She denies any changes to her bowels, bladder or vision.  She remains on Wellbutrin for anxiety.  She has continued to work at [employer], and has been under more Statisticianstress overseeing renovation. She is not exercising as much as she used to. She presents today for follow-up unaccompanied.  HISTORY 11/21/2017 Dr. Anne HahnWillis: [name] is a 63 year old right-handed white female with a history of multiple sclerosis.  The patient was diagnosed 33 years ago, she has been relatively stable on Avonex for many years.  The patient has had MRI evaluation of the brain done 1 year ago, this shows no progression of her lesions, the patient has findings very typical on MRI for multiple sclerosis.  The patient reports no significant side effects on the Avonex, she gets annual blood work done through her primary care physician, her last blood work was done in April 2019.  The patient denies any significant fatigue, she denies any new numbness or weakness of the face, arms, legs.  She denies any visual changes.  She does get an eye examination once a year.  The patient does report some persistent sensory symptoms that worsen with fatigue and with heat exposure.  The patient reports some arthritic symptoms, no other new complaints.  She  returns for an evaluation.  REVIEW OF SYSTEMS: Out of a complete 14 system review of symptoms, the patient complains only of the following symptoms, and all other reviewed systems are negative.  Headache, paresthesia  ALLERGIES: Allergies  Allergen Reactions  . Sulfa Antibiotics Hives    HOME MEDICATIONS: Outpatient Medications Prior to Visit  Medication Sig Dispense Refill  . buPROPion (WELLBUTRIN XL) 300 MG 24 hr tablet TAKE 1 TABLET BY MOUTH EVERY DAY 90 tablet 2  . ibuprofen (ADVIL,MOTRIN) 200 MG tablet Take 600 mg by mouth every 6 (six) hours as needed.      . Melatonin 3 MG TABS Take 1.5 tablets by mouth as needed.    . Interferon Beta-1a (AVONEX PEN) 30 MCG/0.5ML AJKT Inject 0.5 mLs into the muscle once a week. 1 each 11   No facility-administered medications prior to visit.     PAST MEDICAL HISTORY: Past Medical History:  Diagnosis Date  . Multiple sclerosis (HCC)   . PONV (postoperative nausea and vomiting)     PAST SURGICAL HISTORY: Past Surgical History:  Procedure Laterality Date  . CHOLECYSTECTOMY      FAMILY HISTORY: Family History  Problem Relation Age of Onset  . Diabetes Mother   . Breast cancer Mother   . Diabetes Father   . Diabetes Sister     SOCIAL HISTORY: Social History   Socioeconomic History  . Marital status: Married    Spouse name: Onalee HuaDavid   . Number of  children: 2  . Years of education: BA   . Highest education level: Not on file  Occupational History    Employer: [employer]  Social Needs  . Financial resource strain: Not on file  . Food insecurity    Worry: Not on file    Inability: Not on file  . Transportation needs    Medical: Not on file    Non-medical: Not on file  Tobacco Use  . Smoking status: Never Smoker  . Smokeless tobacco: Never Used  Substance and Sexual Activity  . Alcohol use: Yes    Comment: socially 3-5 per week   . Drug use: No  . Sexual activity: Never  Lifestyle  . Physical activity     Days per week: Not on file    Minutes per session: Not on file  . Stress: Not on file  Relationships  . Social Musician on phone: Not on file    Gets together: Not on file    Attends religious service: Not on file    Active member of club or organization: Not on file    Attends meetings of clubs or organizations: Not on file    Relationship status: Not on file  . Intimate partner violence    Fear of current or ex partner: Not on file    Emotionally abused: Not on file    Physically abused: Not on file    Forced sexual activity: Not on file  Other Topics Concern  . Not on file  Social History Narrative   Patient lives at home with her husband and she has 2 children. 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    PHYSICAL EXAM  Vitals:   11/27/18 0811  BP: 111/71  Pulse: 86  Temp: (!) 97.5 F (36.4 C)  Weight: 141 lb 12.8 oz (64.3 kg)  Height: 5\' 3"  (1.6 m)   Body mass index is 25.12 kg/m.  Generalized: Well developed, in no acute distress   Neurological examination  Mentation: Alert oriented to time, place, history taking. Follows all commands speech and language fluent Cranial nerve II-XII: Pupils were equal round reactive to light. Extraocular movements were full, visual field were full on confrontational test. Facial sensation and strength were normal. . Head turning and shoulder shrug  were normal and symmetric. Motor: The motor testing reveals 5 over 5 strength of all 4 extremities. Good symmetric motor tone is noted throughout.  Sensory: Sensory testing is intact to soft touch on all 4 extremities. No evidence of extinction is noted.  Coordination: Cerebellar testing reveals good finger-nose-finger and heel-to-shin bilaterally.  Gait and station: Gait is normal. Tandem gait is normal. Romberg is negative. No drift is seen.  Reflexes: Deep tendon reflexes are symmetric and normal bilaterally.   DIAGNOSTIC DATA (LABS, IMAGING,  TESTING) - I reviewed patient records, labs, notes, testing and imaging myself where available.  Lab Results  Component Value Date   WBC 6.2 10/07/2014   HGB 13.0 10/07/2014   HCT 39.9 10/07/2014   MCV 91 10/07/2014   PLT 246 10/07/2014      Component Value Date/Time   NA 140 10/12/2016 1406   K 4.6 10/12/2016 1406   CL 99 10/12/2016 1406   CO2 27 10/12/2016 1406   GLUCOSE 107 (H) 10/12/2016 1406   GLUCOSE 98 05/07/2011 0459   BUN 14 10/12/2016 1406   CREATININE 0.78 10/12/2016 1406   CALCIUM 9.4 10/12/2016 1406   PROT 6.9  10/12/2016 1406   ALBUMIN 4.6 10/12/2016 1406   AST 20 10/12/2016 1406   ALT 20 10/12/2016 1406   ALKPHOS 59 10/12/2016 1406   BILITOT 0.2 10/12/2016 1406   GFRNONAA 83 10/12/2016 1406   GFRAA 96 10/12/2016 1406   Lab Results  Component Value Date   CHOL 179 05/07/2011   HDL 49 05/07/2011   LDLCALC 109 (H) 05/07/2011   TRIG 104 05/07/2011   CHOLHDL 3.7 05/07/2011   No results found for: HGBA1C No results found for: VITAMINB12 Lab Results  Component Value Date   TSH 2.495 05/06/2011     ASSESSMENT AND PLAN 63 y.o. year old female  has a past medical history of Multiple sclerosis (HCC) and PONV (postoperative nausea and vomiting). here with:  1.  Multiple sclerosis  Overall, she has done quite well on Avonex.  She will continue Avonex weekly injection for MS.  I will check lab work today to monitor for adverse effects of the medication. She mentions recently developing a headache the day after her injections. She will monitor this, could be related to the heat or stress.  She remains on Wellbutrin, and does not need refills.  She last had MRI of the brain in 2018, was stable.  Her MS has been quite stable, with the exception of right-sided intermittent paresthesias, brought on by fatigue or stress.  She will follow-up in 1 year or sooner if needed. I advised her that if her symptoms worsen or she develops any new symptoms she should let us know.    I spent 15 minutes with the patient. 50% of this time was spent discussing her plan of care.   Butler Denmark, AGNP-C, DNP 11/27/2018, 8:45 AM [facility] [address], [place], [place] [address] [phone]

## 2018-11-27 ENCOUNTER — Encounter: Payer: Self-pay | Admitting: Neurology

## 2018-11-27 ENCOUNTER — Ambulatory Visit (INDEPENDENT_AMBULATORY_CARE_PROVIDER_SITE_OTHER): Payer: PRIVATE HEALTH INSURANCE | Admitting: Neurology

## 2018-11-27 ENCOUNTER — Ambulatory Visit: Payer: PRIVATE HEALTH INSURANCE | Admitting: Nurse Practitioner

## 2018-11-27 ENCOUNTER — Other Ambulatory Visit: Payer: Self-pay

## 2018-11-27 VITALS — BP 111/71 | HR 86 | Temp 97.5°F | Ht 63.0 in | Wt 141.8 lb

## 2018-11-27 DIAGNOSIS — G35 Multiple sclerosis: Secondary | ICD-10-CM | POA: Diagnosis not present

## 2018-11-27 MED ORDER — AVONEX PEN 30 MCG/0.5ML IM AJKT: 0.5000 mL | AUTO-INJECTOR | INTRAMUSCULAR | 11 refills | Status: DC

## 2018-11-27 NOTE — Progress Notes (Signed)
I have read the note, and I agree with the clinical assessment and plan.  Nesiah Jump K Nakaya Mishkin   

## 2018-11-27 NOTE — Patient Instructions (Addendum)
It was wonderful to meet you! I will see you in 1 year. I will call you with your labs once they result. You look great today!hat

## 2018-11-28 ENCOUNTER — Telehealth: Payer: Self-pay

## 2018-11-28 LAB — COMPREHENSIVE METABOLIC PANEL
ALT: 19 IU/L (ref 0–32)
AST: 23 IU/L (ref 0–40)
Albumin/Globulin Ratio: 2.3 — ABNORMAL HIGH (ref 1.2–2.2)
Albumin: 4.5 g/dL (ref 3.8–4.8)
Alkaline Phosphatase: 63 IU/L (ref 39–117)
BUN/Creatinine Ratio: 16 (ref 12–28)
BUN: 13 mg/dL (ref 8–27)
Bilirubin Total: 0.2 mg/dL (ref 0.0–1.2)
CO2: 25 mmol/L (ref 20–29)
Calcium: 9.5 mg/dL (ref 8.7–10.3)
Chloride: 99 mmol/L (ref 96–106)
Creatinine, Ser: 0.8 mg/dL (ref 0.57–1.00)
GFR calc Af Amer: 91 mL/min/{1.73_m2} (ref >59)
GFR calc non Af Amer: 79 mL/min/{1.73_m2} (ref >59)
Globulin, Total: 2 g/dL (ref 1.5–4.5)
Glucose: 82 mg/dL (ref 65–99)
Potassium: 4.4 mmol/L (ref 3.5–5.2)
Sodium: 139 mmol/L (ref 134–144)
Total Protein: 6.5 g/dL (ref 6.0–8.5)

## 2018-11-28 LAB — CBC WITH DIFFERENTIAL/PLATELET
Basophils Absolute: 0 10*3/uL (ref 0.0–0.2)
Basos: 1 %
EOS (ABSOLUTE): 0.2 10*3/uL (ref 0.0–0.4)
Eos: 3 %
Hematocrit: 41.6 % (ref 34.0–46.6)
Hemoglobin: 14.1 g/dL (ref 11.1–15.9)
Immature Grans (Abs): 0 10*3/uL (ref 0.0–0.1)
Immature Granulocytes: 0 %
Lymphocytes Absolute: 1.7 10*3/uL (ref 0.7–3.1)
Lymphs: 32 %
MCH: 30.6 pg (ref 26.6–33.0)
MCHC: 33.9 g/dL (ref 31.5–35.7)
MCV: 90 fL (ref 79–97)
Monocytes Absolute: 0.8 10*3/uL (ref 0.1–0.9)
Monocytes: 14 %
Neutrophils Absolute: 2.6 10*3/uL (ref 1.4–7.0)
Neutrophils: 50 %
Platelets: 258 10*3/uL (ref 150–450)
RBC: 4.61 x10E6/uL (ref 3.77–5.28)
RDW: 11.9 % (ref 11.7–15.4)
WBC: 5.3 10*3/uL (ref 3.4–10.8)

## 2018-12-20 NOTE — Telephone Encounter (Signed)
Called the pt and shared with them the results of their lab work.   Cher Nakai, [place]. 11/28/18.

## 2019-04-10 ENCOUNTER — Ambulatory Visit (INDEPENDENT_AMBULATORY_CARE_PROVIDER_SITE_OTHER): Payer: PRIVATE HEALTH INSURANCE

## 2019-04-10 ENCOUNTER — Other Ambulatory Visit: Payer: Self-pay

## 2019-04-10 ENCOUNTER — Other Ambulatory Visit: Payer: Self-pay | Admitting: Podiatry

## 2019-04-10 ENCOUNTER — Encounter: Payer: Self-pay | Admitting: Podiatry

## 2019-04-10 ENCOUNTER — Ambulatory Visit (INDEPENDENT_AMBULATORY_CARE_PROVIDER_SITE_OTHER): Payer: PRIVATE HEALTH INSURANCE | Admitting: Podiatry

## 2019-04-10 VITALS — BP 111/72 | HR 83 | Resp 16

## 2019-04-10 DIAGNOSIS — M2011 Hallux valgus (acquired), right foot: Secondary | ICD-10-CM

## 2019-04-10 DIAGNOSIS — M79672 Pain in left foot: Secondary | ICD-10-CM | POA: Diagnosis not present

## 2019-04-10 DIAGNOSIS — M7672 Peroneal tendinitis, left leg: Secondary | ICD-10-CM | POA: Diagnosis not present

## 2019-04-10 DIAGNOSIS — B351 Tinea unguium: Secondary | ICD-10-CM

## 2019-04-10 NOTE — Progress Notes (Signed)
   Subjective:    Patient ID: [name], female    DOB: [birth year], 63 y.o.   MRN: [id number]  HPI    Review of Systems  All other systems reviewed and are negative.      Objective:   Physical Exam        Assessment & Plan:

## 2019-04-10 NOTE — Patient Instructions (Signed)

## 2019-04-12 NOTE — Progress Notes (Signed)
Subjective:   Patient ID: [name], female   DOB: 63 y.o.   MRN: [id number]   HPI Patient presents stating she has had discomfort in her arch feels like she has been walking differently and has developed a lot of pain in the outside the left foot which has been inflamed and sore and makes it hard for her to be active.  Patient states that this is been ongoing and gradually getting worse over the last few months and patient does not smoke and likes to be active and also concerned about discoloration of nailbeds   Review of Systems  All other systems reviewed and are negative.       Objective:  Physical Exam Vitals and nursing note reviewed.  Constitutional:      Appearance: She is well-developed.  Pulmonary:     Effort: Pulmonary effort is normal.  Musculoskeletal:        General: Normal range of motion.  Skin:    General: Skin is warm.  Neurological:     Mental Status: She is alert.     Neurovascular status intact muscle strength found to be adequate range of motion within normal limits with inflammation pain around the side of the left foot peroneal base insertion fifth metatarsal with mild inflammation plantar arch which appears to be improving but compensatory lateral pain is now her biggest problem.  Patient has light discoloration of nailbeds     Assessment:  Peroneal tendinitis which is probably related to compensatory gait and is become primary problem along with mild nail disease a     Plan:  H&P x-rays reviewed conditions discussed and at this point I did go ahead and I did sterile prep and injected the peroneal insertion left 3 mg Kenalog 5 mg Xylocaine and applied fascial brace to lift up the lateral side of the foot along with ice therapy.  I then dispensed formula 7 to try to help with nail disease and patient will be seen back to recheck  X-rays indicate there is not indications of a bony injury appears to be soft tissue

## 2019-04-24 ENCOUNTER — Ambulatory Visit (INDEPENDENT_AMBULATORY_CARE_PROVIDER_SITE_OTHER): Payer: PRIVATE HEALTH INSURANCE | Admitting: Podiatry

## 2019-04-24 ENCOUNTER — Encounter: Payer: Self-pay | Admitting: Podiatry

## 2019-04-24 ENCOUNTER — Other Ambulatory Visit: Payer: Self-pay

## 2019-04-24 DIAGNOSIS — M7672 Peroneal tendinitis, left leg: Secondary | ICD-10-CM | POA: Diagnosis not present

## 2019-04-24 NOTE — Progress Notes (Signed)
Subjective:   Patient ID: [name], female   DOB: 63 y.o.   MRN: [id number]   HPI Patient presents stating my foot seems to feel better and still having pain but it is mild in its intensity   ROS      Objective:  Physical Exam  Neurovascular status intact with patient's lateral foot pain improved with mild discomfort only upon deep palpation     Assessment:  Improved peroneal tendinitis left     Plan:  H&P reviewed condition and recommended continued physical therapy anti-inflammatories and wearing of brace.  Patient will be seen back on an as-needed basis

## 2019-05-21 DIAGNOSIS — M79642 Pain in left hand: Secondary | ICD-10-CM | POA: Insufficient documentation

## 2019-05-22 DIAGNOSIS — M1812 Unilateral primary osteoarthritis of first carpometacarpal joint, left hand: Secondary | ICD-10-CM | POA: Insufficient documentation

## 2019-07-13 ENCOUNTER — Other Ambulatory Visit: Payer: Self-pay | Admitting: Neurology

## 2019-08-12 ENCOUNTER — Other Ambulatory Visit: Payer: Self-pay | Admitting: Family Medicine

## 2019-08-12 DIAGNOSIS — Z1231 Encounter for screening mammogram for malignant neoplasm of breast: Secondary | ICD-10-CM

## 2019-08-13 ENCOUNTER — Other Ambulatory Visit: Payer: Self-pay | Admitting: Family Medicine

## 2019-08-13 DIAGNOSIS — N644 Mastodynia: Secondary | ICD-10-CM

## 2019-08-13 DIAGNOSIS — Z1231 Encounter for screening mammogram for malignant neoplasm of breast: Secondary | ICD-10-CM

## 2019-09-12 ENCOUNTER — Other Ambulatory Visit: Payer: Self-pay

## 2019-09-12 ENCOUNTER — Ambulatory Visit
Admission: RE | Admit: 2019-09-12 | Discharge: 2019-09-12 | Disposition: A | Discharge status: Home / Self Care | Payer: PRIVATE HEALTH INSURANCE | Source: Ambulatory Visit | Attending: Family Medicine | Admitting: Family Medicine

## 2019-09-12 DIAGNOSIS — Z1231 Encounter for screening mammogram for malignant neoplasm of breast: Secondary | ICD-10-CM

## 2019-09-12 DIAGNOSIS — N644 Mastodynia: Secondary | ICD-10-CM

## 2019-10-28 ENCOUNTER — Other Ambulatory Visit: Payer: Self-pay | Admitting: Neurology

## 2019-11-27 ENCOUNTER — Telehealth: Payer: Self-pay | Admitting: Neurology

## 2019-11-27 NOTE — Telephone Encounter (Signed)
Left pt VM stating that her 07/29 appointment has been cancelled. Provided office # and requested cb to get her rescheduled.

## 2019-11-28 ENCOUNTER — Ambulatory Visit: Payer: PRIVATE HEALTH INSURANCE | Admitting: Neurology

## 2019-12-18 ENCOUNTER — Telehealth: Payer: Self-pay | Admitting: *Deleted

## 2019-12-18 NOTE — Telephone Encounter (Signed)
Called patient to reschedule her one year FU. Scheduled for soonest, in Nov and put her on wait list. She  verbalized understanding, appreciation.

## 2020-03-04 ENCOUNTER — Ambulatory Visit (INDEPENDENT_AMBULATORY_CARE_PROVIDER_SITE_OTHER): Payer: PRIVATE HEALTH INSURANCE | Admitting: Neurology

## 2020-03-04 ENCOUNTER — Encounter: Payer: Self-pay | Admitting: Neurology

## 2020-03-04 VITALS — BP 122/74 | Ht 63.0 in | Wt 139.8 lb

## 2020-03-04 DIAGNOSIS — G35 Multiple sclerosis: Secondary | ICD-10-CM

## 2020-03-04 MED ORDER — AVONEX PEN 30 MCG/0.5ML IM AJKT: AUTO-INJECTOR | INTRAMUSCULAR | 11 refills | Status: DC

## 2020-03-04 MED ORDER — BUPROPION HCL ER (XL) 300 MG PO TB24: 300.0000 mg | ORAL_TABLET | Freq: Every day | ORAL | 3 refills | Status: DC

## 2020-03-04 NOTE — Progress Notes (Signed)
PATIENT: [name] DOB: [birth year]  REASON FOR VISIT: follow up HISTORY FROM: patient  HISTORY OF PRESENT ILLNESS: Today 03/04/20 [name] is a 64 year old female with history of MS, relatively stable on Avonex for many years.  MRI of the brain in July 2018 was stable. Is on Wellbutrin for depression.  She has persistent right-sided sensory symptoms that may worsen with heat, fatigue, or stress.  No overt weakness.  No changes to the vision, B/B, or gait.  No falls.  Remains active, lives with her husband, continues to work a [employer], is diligent about walking for exercise.  Overall health has been stable.  Wonders if over time, she may be able to come off MS medications altogether.  Presents today for evaluation unaccompanied.   HISTORY 11/27/2018 SS: [name] is a 64 year old female with history of multiple sclerosis.  She has been relatively stable on Avonex for many years.  She had MRI of the brain in July 2018, was stable, no new findings.  She does report some persistent right sided sensory symptoms that worsen with fatigue or heat exposure.  She reports she has been tolerating Avonex well.  She does say after the last several injections she has had a headache the day after.  This could be attributed to stress or heat.  She says her overall health is good.  She denies any new numbness or weakness.  She denies any changes to her bowels, bladder or vision.  She remains on Wellbutrin for anxiety.  She has continued to work at [employer], and has been under more Statistician. She is not exercising as much as she used to. She presents today for follow-up unaccompanied.   REVIEW OF SYSTEMS: Out of a complete 14 system review of symptoms, the patient complains only of the following symptoms, and all other reviewed systems are negative.  Paresthesia  ALLERGIES: Allergies  Allergen Reactions  . Sulfa Antibiotics Hives    HOME MEDICATIONS: Outpatient Medications  Prior to Visit  Medication Sig Dispense Refill  . CALCIUM PO Take 1 tablet by mouth daily.    Marland Kitchen ibuprofen (ADVIL,MOTRIN) 200 MG tablet Take 600 mg by mouth every 6 (six) hours as needed.      . Melatonin 3 MG TABS Take 1.5 tablets by mouth as needed.    . AVONEX PEN 30 MCG/0.5ML AJKT Inject intramuscularly *ONCE per week as directed 1 each 11  . buPROPion (WELLBUTRIN XL) 300 MG 24 hr tablet TAKE 1 TABLET BY MOUTH EVERY DAY 90 tablet 2   No facility-administered medications prior to visit.    PAST MEDICAL HISTORY: Past Medical History:  Diagnosis Date  . Multiple sclerosis (HCC)   . PONV (postoperative nausea and vomiting)     PAST SURGICAL HISTORY: Past Surgical History:  Procedure Laterality Date  . CHOLECYSTECTOMY      FAMILY HISTORY: Family History  Problem Relation Age of Onset  . Diabetes Mother   . Breast cancer Mother 49  . Diabetes Father   . Diabetes Sister     SOCIAL HISTORY: Social History   Socioeconomic History  . Marital status: Married    Spouse name: Onalee Hua   . Number of children: 2  . Years of education: BA   . Highest education level: Not on file  Occupational History    Employer: [employer]  Tobacco Use  . Smoking status: Never Smoker  . Smokeless tobacco: Never Used  Substance and Sexual Activity  .  Alcohol use: Yes    Comment: socially 3-5 per week   . Drug use: No  . Sexual activity: Never  Other Topics Concern  . Not on file  Social History Narrative   Patient lives at home with her husband and she has 2 children. 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    Social Determinants of Health   Financial Resource Strain:   . Difficulty of Paying Living Expenses: Not on file  Food Insecurity:   . Worried About Programme researcher, broadcasting/film/video in the Last Year: Not on file  . Ran Out of Food in the Last Year: Not on file  Transportation Needs:   . Lack of Transportation (Medical): Not on file  . Lack of  Transportation (Non-Medical): Not on file  Physical Activity:   . Days of Exercise per Week: Not on file  . Minutes of Exercise per Session: Not on file  Stress:   . Feeling of Stress : Not on file  Social Connections:   . Frequency of Communication with Friends and Family: Not on file  . Frequency of Social Gatherings with Friends and Family: Not on file  . Attends Religious Services: Not on file  . Active Member of Clubs or Organizations: Not on file  . Attends Banker Meetings: Not on file  . Marital Status: Not on file  Intimate Partner Violence:   . Fear of Current or Ex-Partner: Not on file  . Emotionally Abused: Not on file  . Physically Abused: Not on file  . Sexually Abused: Not on file   PHYSICAL EXAM  Vitals:   03/04/20 1012  BP: 122/74  Weight: 139 lb 12.8 oz (63.4 kg)  Height: 5\' 3"  (1.6 m)   Body mass index is 24.76 kg/m.  Generalized: Well developed, in no acute distress  Neurological examination  Mentation: Alert oriented to time, place, history taking. Follows all commands speech and language fluent Cranial nerve II-XII: Pupils were equal round reactive to light. Extraocular movements were full, visual field were full on confrontational test. Facial sensation and strength were normal. Head turning and shoulder shrug were normal and symmetric. Motor: The motor testing reveals 5 over 5 strength of all 4 extremities. Good symmetric motor tone is noted throughout.  Sensory: Right face, arm, leg is hypersensitive to soft touch Coordination: Cerebellar testing reveals good finger-nose-finger and heel-to-shin bilaterally.  Gait and station: Gait is normal. Tandem gait is normal. Romberg is negative. No drift is seen.  Reflexes: Deep tendon reflexes are symmetric and normal bilaterally.   DIAGNOSTIC DATA (LABS, IMAGING, TESTING) - I reviewed patient records, labs, notes, testing and imaging myself where available.  Lab Results  Component Value Date    WBC 5.3 11/27/2018   HGB 14.1 11/27/2018   HCT 41.6 11/27/2018   MCV 90 11/27/2018   PLT 258 11/27/2018      Component Value Date/Time   NA 139 11/27/2018 0839   K 4.4 11/27/2018 0839   CL 99 11/27/2018 0839   CO2 25 11/27/2018 0839   GLUCOSE 82 11/27/2018 0839   GLUCOSE 98 05/07/2011 0459   BUN 13 11/27/2018 0839   CREATININE 0.80 11/27/2018 0839   CALCIUM 9.5 11/27/2018 0839   PROT 6.5 11/27/2018 0839   ALBUMIN 4.5 11/27/2018 0839   AST 23 11/27/2018 0839   ALT 19 11/27/2018 0839   ALKPHOS 63 11/27/2018 0839   BILITOT 0.2 11/27/2018 0839   GFRNONAA 79 11/27/2018 0839   GFRAA 91  11/27/2018 0839   Lab Results  Component Value Date   CHOL 179 05/07/2011   HDL 49 05/07/2011   LDLCALC 109 (H) 05/07/2011   TRIG 104 05/07/2011   CHOLHDL 3.7 05/07/2011   No results found for: HGBA1C No results found for: VITAMINB12 Lab Results  Component Value Date   TSH 2.495 05/06/2011   ASSESSMENT AND PLAN 64 y.o. year old female  has a past medical history of Multiple sclerosis (HCC) and PONV (postoperative nausea and vomiting). here with:  1.  Multiple sclerosis  -Has remained overall stable on Avonex -Continue Avonex weekly injections -PCP checks routine blood work, was done in May, reportedly normal -MRI of the brain in 2018 was stable, talked about repeat, decided to hold off -Encouraged to remain active and continue exercise -Follow-up in 1 year or sooner if needed, will check in with Dr. Anne Hahn before he retires  2.  Depression -Continue Wellbutrin XL 300 mg daily  I spent 20 minutes of face-to-face and non-face-to-face time with patient.  This included previsit chart review, lab review, study review, order entry, electronic health record documentation, patient education.  Margie Ege, AGNP-C, DNP 03/04/2020, 10:46 AM [facility] [address] [place], [place] [address] [phone]

## 2020-03-04 NOTE — Progress Notes (Signed)
I have read the note, and I agree with the clinical assessment and plan.  Charleton Deyoung K Blessings Inglett   

## 2020-03-04 NOTE — Patient Instructions (Signed)
Continue current medications  I think you are doing well ! See you back in 1 year with Dr. Anne Hahn

## 2020-04-01 ENCOUNTER — Encounter: Payer: Self-pay | Admitting: Neurology

## 2020-05-06 DIAGNOSIS — M25561 Pain in right knee: Secondary | ICD-10-CM | POA: Insufficient documentation

## 2020-05-25 DIAGNOSIS — M65312 Trigger thumb, left thumb: Secondary | ICD-10-CM | POA: Insufficient documentation

## 2020-05-29 ENCOUNTER — Other Ambulatory Visit: Payer: Self-pay | Admitting: Neurology

## 2020-06-22 DIAGNOSIS — M79644 Pain in right finger(s): Secondary | ICD-10-CM | POA: Insufficient documentation

## 2020-09-23 ENCOUNTER — Other Ambulatory Visit: Payer: Self-pay | Admitting: Family Medicine

## 2020-09-23 DIAGNOSIS — M8580 Other specified disorders of bone density and structure, unspecified site: Secondary | ICD-10-CM

## 2020-11-04 DIAGNOSIS — Z4789 Encounter for other orthopedic aftercare: Secondary | ICD-10-CM | POA: Insufficient documentation

## 2021-01-15 ENCOUNTER — Ambulatory Visit: Payer: PRIVATE HEALTH INSURANCE | Admitting: Podiatry

## 2021-01-18 ENCOUNTER — Ambulatory Visit (INDEPENDENT_AMBULATORY_CARE_PROVIDER_SITE_OTHER): Payer: PRIVATE HEALTH INSURANCE | Admitting: Podiatry

## 2021-01-18 ENCOUNTER — Encounter: Payer: Self-pay | Admitting: Podiatry

## 2021-01-18 ENCOUNTER — Ambulatory Visit (INDEPENDENT_AMBULATORY_CARE_PROVIDER_SITE_OTHER): Payer: PRIVATE HEALTH INSURANCE

## 2021-01-18 ENCOUNTER — Other Ambulatory Visit: Payer: Self-pay

## 2021-01-18 DIAGNOSIS — M722 Plantar fascial fibromatosis: Secondary | ICD-10-CM

## 2021-01-18 DIAGNOSIS — M79671 Pain in right foot: Secondary | ICD-10-CM | POA: Diagnosis not present

## 2021-01-18 MED ORDER — TRIAMCINOLONE ACETONIDE 10 MG/ML IJ SUSP
10.0000 mg | Freq: Once | INTRAMUSCULAR | Status: AC
  Administered 2021-01-18: 10 mg

## 2021-01-18 NOTE — Patient Instructions (Signed)

## 2021-01-19 ENCOUNTER — Other Ambulatory Visit: Payer: Self-pay | Admitting: Podiatry

## 2021-01-19 DIAGNOSIS — M722 Plantar fascial fibromatosis: Secondary | ICD-10-CM

## 2021-01-20 NOTE — Progress Notes (Signed)
Subjective:   Patient ID: [name], female   DOB: 64 y.o.   MRN: [id number]   HPI Patient presents stating she is developed a lot of pain in the bottom of her right heel over the last approximate 6 weeks.  States it worsened over that time and she is tried Mining engineer modifications and other modalities without relief   ROS      Objective:  Physical Exam  Neurovascular status intact with exquisite discomfort plantar aspect right heel at the insertional point tendon calcaneus with inflammation fluid in the medial band     Assessment:  Acute Planter fasciitis right with inflammation fluid of the medial band     Plan:  H&P reviewed condition took x-rays and today sterile prep and injected the medial band 3 mg Kenalog 5 mg Xylocaine advised on supportive therapy anti-inflammatories and reappoint to recheck  Indications are for small spur formation no indication stress fracture arthritis

## 2021-02-02 ENCOUNTER — Telehealth: Payer: Self-pay | Admitting: Neurology

## 2021-02-02 ENCOUNTER — Other Ambulatory Visit: Payer: Self-pay | Admitting: Neurology

## 2021-02-02 MED ORDER — AVONEX PEN 30 MCG/0.5ML IM AJKT: 30.0000 ug | AUTO-INJECTOR | INTRAMUSCULAR | 0 refills | Status: DC

## 2021-02-02 NOTE — Telephone Encounter (Signed)
Pt called stating that [facility] is needing authorization for her AVONEX PEN 30 MCG/0.5ML AJKT Please call [phone]

## 2021-02-02 NOTE — Telephone Encounter (Signed)
Refills sent to [facility]. Pt has pending appt 02/24/21.

## 2021-02-17 ENCOUNTER — Other Ambulatory Visit: Payer: Self-pay | Admitting: Family Medicine

## 2021-02-17 ENCOUNTER — Other Ambulatory Visit: Payer: Self-pay

## 2021-02-17 ENCOUNTER — Ambulatory Visit
Admission: RE | Admit: 2021-02-17 | Discharge: 2021-02-17 | Disposition: A | Discharge status: Home / Self Care | Payer: PRIVATE HEALTH INSURANCE | Source: Ambulatory Visit | Attending: Family Medicine | Admitting: Family Medicine

## 2021-02-17 DIAGNOSIS — Z1231 Encounter for screening mammogram for malignant neoplasm of breast: Secondary | ICD-10-CM

## 2021-02-17 DIAGNOSIS — M8580 Other specified disorders of bone density and structure, unspecified site: Secondary | ICD-10-CM

## 2021-02-24 ENCOUNTER — Ambulatory Visit (INDEPENDENT_AMBULATORY_CARE_PROVIDER_SITE_OTHER): Payer: PRIVATE HEALTH INSURANCE | Admitting: Neurology

## 2021-02-24 ENCOUNTER — Encounter: Payer: Self-pay | Admitting: Neurology

## 2021-02-24 ENCOUNTER — Other Ambulatory Visit: Payer: Self-pay

## 2021-02-24 VITALS — BP 126/73 | HR 85 | Ht 63.0 in | Wt 142.0 lb

## 2021-02-24 DIAGNOSIS — Z5181 Encounter for therapeutic drug level monitoring: Secondary | ICD-10-CM | POA: Diagnosis not present

## 2021-02-24 DIAGNOSIS — G35 Multiple sclerosis: Secondary | ICD-10-CM | POA: Diagnosis not present

## 2021-02-24 NOTE — Progress Notes (Signed)
Reason for visit: Multiple sclerosis  [name] is an 65 y.o. female  History of present illness:  [name] is a 65 year old right-handed white female with a history of multiple sclerosis.  The patient has noted that her initial diagnosis occurred in 1986, her last clinical exacerbation was in 1996.  At that time, she had right greater than left sided sensory changes that have persisted to the present day.  The patient however has been very stable since 1996 on Avonex.  The patient reports no new numbness, weakness, vision changes, or difficulty with balance.  She denies issues controlling the bowels or the bladder.  She tolerates the Avonex well.  She returns to the office today for an evaluation.  Past Medical History:  Diagnosis Date   Multiple sclerosis (HCC)    PONV (postoperative nausea and vomiting)     Past Surgical History:  Procedure Laterality Date   CHOLECYSTECTOMY      Family History  Problem Relation Age of Onset   Diabetes Mother    Breast cancer Mother 56   Diabetes Father    Diabetes Sister     Social history:  reports that she has never smoked. She has never used smokeless tobacco. She reports current alcohol use. She reports that she does not use drugs.    Allergies  Allergen Reactions   Sulfa Antibiotics Hives    Medications:  Prior to Admission medications   Medication Sig Start Date End Date Taking? Authorizing Provider  buPROPion (WELLBUTRIN XL) 300 MG 24 hr tablet Take 1 tablet (300 mg total) by mouth daily. 03/04/20   Glean Salvo, NP  CALCIUM PO Take 1 tablet by mouth daily.    [provider]  ibuprofen (ADVIL,MOTRIN) 200 MG tablet Take 600 mg by mouth every 6 (six) hours as needed.      [provider]  Interferon Beta-1a (AVONEX PEN) 30 MCG/0.5ML AJKT Inject 30 mcg as directed once a week. 02/02/21   York Spaniel, MD  Melatonin 3 MG TABS Take 1.5 tablets by mouth as needed.    [provider]     ROS:  Out of a complete 14 system review of symptoms, the patient complains only of the following symptoms, and all other reviewed systems are negative.  Numbness  Blood pressure 126/73, pulse 85, height 5\' 3"  (1.6 m), weight 142 lb (64.4 kg).  Physical Exam  General: The patient is alert and cooperative at the time of the examination.  Skin: No significant peripheral edema is noted.   Neurologic Exam  Mental status: The patient is alert and oriented x 3 at the time of the examination. The patient has apparent normal recent and remote memory, with an apparently normal attention span and concentration ability.   Cranial nerves: Facial symmetry is present. Speech is normal, no aphasia or dysarthria is noted. Extraocular movements are full. Visual fields are full.  Pupils are equal, round, and reactive to light.  Discs are flat bilaterally.  Motor: The patient has good strength in all 4 extremities.  Sensory examination: Soft touch sensation is symmetric on the face, but is slightly decreased on the right arm and leg as compared to the left.  Coordination: The patient has good finger-nose-finger and heel-to-shin bilaterally.  Gait and station: The patient has a normal gait. Tandem gait is normal. Romberg is negative. No drift is seen.  Reflexes: Deep tendon reflexes are symmetric.   Assessment/Plan:  1.  Multiple sclerosis  The  patient has been quite stable with her MS, she has not demonstrated any secondary progressive features.  I suspect at this point she could stop the Avonex.  We will check MRI of the brain and follow-up in 1 year.  We will check MRI evaluations regularly over the next several years to ensure good MS stability.  The patient will contact our office if new neurologic symptoms are noted.  She will be followed through Dr. Epimenio Foot in the future.  We will check blood work today.  Marlan Palau MD 02/24/2021 11:40 AM  [facility] [address] [place], [place] [address]  Phone [phone] Fax [phone]

## 2021-02-25 LAB — COMPREHENSIVE METABOLIC PANEL
ALT: 21 IU/L (ref 0–32)
AST: 21 IU/L (ref 0–40)
Albumin/Globulin Ratio: 2.1 (ref 1.2–2.2)
Albumin: 4.9 g/dL — ABNORMAL HIGH (ref 3.8–4.8)
Alkaline Phosphatase: 75 IU/L (ref 44–121)
BUN/Creatinine Ratio: 18 (ref 12–28)
BUN: 14 mg/dL (ref 8–27)
Bilirubin Total: 0.4 mg/dL (ref 0.0–1.2)
CO2: 26 mmol/L (ref 20–29)
Calcium: 10.6 mg/dL — ABNORMAL HIGH (ref 8.7–10.3)
Chloride: 100 mmol/L (ref 96–106)
Creatinine, Ser: 0.78 mg/dL (ref 0.57–1.00)
Globulin, Total: 2.3 g/dL (ref 1.5–4.5)
Glucose: 87 mg/dL (ref 70–99)
Potassium: 4.3 mmol/L (ref 3.5–5.2)
Sodium: 141 mmol/L (ref 134–144)
Total Protein: 7.2 g/dL (ref 6.0–8.5)
eGFR: 85 mL/min/{1.73_m2} (ref >59)

## 2021-03-01 ENCOUNTER — Telehealth: Payer: Self-pay | Admitting: Neurology

## 2021-03-01 NOTE — Telephone Encounter (Signed)
Med pay auth: NPR ref # [id number] on 03/01/21 order sent to [facility], they will reach out to the patient to schedule.

## 2021-03-08 ENCOUNTER — Telehealth: Payer: Self-pay | Admitting: Neurology

## 2021-03-08 NOTE — Telephone Encounter (Signed)
[name] has called to report pt is no longer taking avonex, please call to advise what pt should take in the place of Avonex [phone] option 2

## 2021-03-08 NOTE — Telephone Encounter (Signed)
Per Dr. Anne Hahn plan from 02/24/2021: The patient has been quite stable with her MS, she has not demonstrated any secondary progressive features.  I suspect at this point she could stop the Avonex.  We will check MRI of the brain and follow-up in 1 year.  We will check MRI evaluations regularly over the next several years to ensure good MS stability.  The patient will contact our office if new neurologic symptoms are noted.  Nothing needed on this for now.

## 2021-03-10 ENCOUNTER — Ambulatory Visit: Payer: PRIVATE HEALTH INSURANCE | Admitting: Neurology

## 2021-03-18 ENCOUNTER — Ambulatory Visit
Admission: RE | Admit: 2021-03-18 | Discharge: 2021-03-18 | Disposition: A | Discharge status: Home / Self Care | Payer: PRIVATE HEALTH INSURANCE | Source: Ambulatory Visit | Attending: Family Medicine | Admitting: Family Medicine

## 2021-03-18 ENCOUNTER — Other Ambulatory Visit: Payer: Self-pay

## 2021-03-18 DIAGNOSIS — Z1231 Encounter for screening mammogram for malignant neoplasm of breast: Secondary | ICD-10-CM

## 2021-03-23 ENCOUNTER — Ambulatory Visit
Admission: RE | Admit: 2021-03-23 | Discharge: 2021-03-23 | Disposition: A | Discharge status: Home / Self Care | Payer: PRIVATE HEALTH INSURANCE | Source: Ambulatory Visit | Attending: Neurology | Admitting: Neurology

## 2021-03-23 ENCOUNTER — Other Ambulatory Visit: Payer: Self-pay

## 2021-03-23 DIAGNOSIS — G35 Multiple sclerosis: Secondary | ICD-10-CM

## 2021-03-23 MED ORDER — GADOBENATE DIMEGLUMINE 529 MG/ML IV SOLN
13.0000 mL | Freq: Once | INTRAVENOUS | Status: AC | PRN
  Administered 2021-03-23: 13 mL via INTRAVENOUS

## 2021-06-01 ENCOUNTER — Telehealth: Payer: Self-pay | Admitting: Neurology

## 2021-06-01 MED ORDER — BUPROPION HCL ER (XL) 300 MG PO TB24: 300.0000 mg | ORAL_TABLET | Freq: Every day | ORAL | 3 refills | Status: DC

## 2021-06-01 NOTE — Addendum Note (Signed)
Addended by: Rosezella Florida on: 06/01/2021 11:55 AM   Modules accepted: Orders

## 2021-06-01 NOTE — Telephone Encounter (Signed)
Pt called in needing a refill of her buPROPion (WELLBUTRIN XL) 300 MG 24 hr tablet to be sent to the [facility] on [address].

## 2021-06-01 NOTE — Telephone Encounter (Signed)
Rx sent in

## 2021-09-19 DIAGNOSIS — M2391 Unspecified internal derangement of right knee: Secondary | ICD-10-CM | POA: Insufficient documentation

## 2022-02-16 ENCOUNTER — Other Ambulatory Visit: Payer: Self-pay | Admitting: Family Medicine

## 2022-02-16 DIAGNOSIS — Z1231 Encounter for screening mammogram for malignant neoplasm of breast: Secondary | ICD-10-CM

## 2022-02-24 ENCOUNTER — Ambulatory Visit: Payer: PRIVATE HEALTH INSURANCE | Admitting: Neurology

## 2022-04-05 ENCOUNTER — Ambulatory Visit
Admission: RE | Admit: 2022-04-05 | Discharge: 2022-04-05 | Disposition: A | Discharge status: Home / Self Care | Payer: PRIVATE HEALTH INSURANCE | Source: Ambulatory Visit | Attending: Family Medicine | Admitting: Family Medicine

## 2022-04-05 DIAGNOSIS — Z1231 Encounter for screening mammogram for malignant neoplasm of breast: Secondary | ICD-10-CM

## 2022-05-11 NOTE — Progress Notes (Unsigned)
Patient: [name] Date of Birth: [birth year]  Reason for Visit: Follow up for MS History from: Patient Primary Neurologist: [name]  ASSESSMENT AND PLAN 67 y.o. year old female   105.  Multiple sclerosis 2.  Anxiety -Has been off Avonex for about a year without any new or worsening symptoms -I would recommend recheck of MRI of the brain, wishes to hold off for now while switching insurances, will contact me within the next 6 months to proceed with ordering  -I will refill her Wellbutrin -Closely monitor symptoms, follow-up in 1 year with Dr. Felecia Russell   HISTORY OF PRESENT ILLNESS: Today 05/12/22 Doing well today. Has been off Avonex for the last year. No new symptoms. Still has tingling to right arm and leg. Can worsen at times when she is tired. It is no worse since off Avonex. Vision is okay, has appointment, thinks vision may not be as good. Balance is good, no falls. Exercise is walking. B/B are fine. Had MRI of the brain with and without contrast in November 2022 was overall stable, no new lesions compared to prior in July 2018. Remains on Wellbutrin, Dr. Jannifer Russell was prescribing, helps with her anxiety. Is now retired, enjoying visiting out of town grand kids.   HISTORY  02/24/21: Dr. Jannifer Russell: [name] is a 67 year old right-handed white female with a history of multiple sclerosis. The patient has noted that her initial diagnosis occurred in 1986, her last clinical exacerbation was in 1996. At that time, she had right greater than left sided sensory changes that have persisted to the present day. The patient however has been very stable since 1996 on Avonex. The patient reports no new numbness, weakness, vision changes, or difficulty with balance. She denies issues controlling the bowels or the bladder. She tolerates the Avonex well. She returns to the office today for an evaluation.   REVIEW OF SYSTEMS: Out of a complete 14 system review of symptoms, the patient complains only  of the following symptoms, and all other reviewed systems are negative.  See HPI  ALLERGIES: Allergies  Allergen Reactions   Sulfa Antibiotics Hives    HOME MEDICATIONS: Outpatient Medications Prior to Visit  Medication Sig Dispense Refill   CALCIUM PO Take 1 tablet by mouth daily.     ibuprofen (ADVIL,MOTRIN) 200 MG tablet Take 600 mg by mouth every 6 (six) hours as needed.       Melatonin 3 MG TABS Take 1.5 tablets by mouth as needed.     buPROPion (WELLBUTRIN XL) 300 MG 24 hr tablet Take 1 tablet (300 mg total) by mouth daily. 90 tablet 3   Interferon Beta-1a (AVONEX PEN) 30 MCG/0.5ML AJKT Inject 30 mcg as directed once a week. 1 each 0   No facility-administered medications prior to visit.    PAST MEDICAL HISTORY: Past Medical History:  Diagnosis Date   Multiple sclerosis ([place])    PONV (postoperative nausea and vomiting)     PAST SURGICAL HISTORY: Past Surgical History:  Procedure Laterality Date   CHOLECYSTECTOMY      FAMILY HISTORY: Family History  Problem Relation Age of Onset   Diabetes Mother    Breast cancer Mother 25   Diabetes Father    Diabetes Sister     SOCIAL HISTORY: Social History   Socioeconomic History   Marital status: Married    Spouse name: [name]    Number of children: 2   Years of education: BA    Highest education level: Not on file  Occupational History    Employer: [employer]  Tobacco Use   Smoking status: Never   Smokeless tobacco: Never  Substance and Sexual Activity   Alcohol use: Yes    Comment: socially 3-5 per week    Drug use: No   Sexual activity: Never  Other Topics Concern   Not on file  Social History Narrative   Patient lives at home with her husband and she has 2 children. 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    Social Determinants of Health   Financial Resource Strain: Not on file  Food Insecurity: Not on file  Transportation Needs: Not on file  Physical  Activity: Not on file  Stress: Not on file  Social Connections: Not on file  Intimate Partner Violence: Not on file   PHYSICAL EXAM  Vitals:   05/12/22 1322  BP: 104/70  Pulse: 84  Weight: 142 lb (64.4 kg)  Height: 5\' 3"  (1.6 m)   Body mass index is 25.15 kg/m.  Generalized: Well developed, in no acute distress  Neurological examination  Mentation: Alert oriented to time, place, history taking. Follows all commands speech and language fluent Cranial nerve II-XII: Pupils were equal round reactive to light. Extraocular movements were full, visual field were full on confrontational test. Facial sensation and strength were normal. Head turning and shoulder shrug  were normal and symmetric. Motor: The motor testing reveals 5 over 5 strength of all 4 extremities. Good symmetric motor tone is noted throughout.  Sensory: Right-sided soft touch sensation to the face, arm, leg is increased hypersensitive Coordination: Cerebellar testing reveals good finger-nose-finger and heel-to-shin bilaterally.  Gait and station: Gait is normal. Tandem gait is normal.  Reflexes: Deep tendon reflexes are symmetric and normal bilaterally.   DIAGNOSTIC DATA (LABS, IMAGING, TESTING) - I reviewed patient records, labs, notes, testing and imaging myself where available.  Lab Results  Component Value Date   WBC 5.3 11/27/2018   HGB 14.1 11/27/2018   HCT 41.6 11/27/2018   MCV 90 11/27/2018   PLT 258 11/27/2018      Component Value Date/Time   NA 141 02/24/2021 1141   K 4.3 02/24/2021 1141   CL 100 02/24/2021 1141   CO2 26 02/24/2021 1141   GLUCOSE 87 02/24/2021 1141   GLUCOSE 98 05/07/2011 0459   BUN 14 02/24/2021 1141   CREATININE 0.78 02/24/2021 1141   CALCIUM 10.6 (H) 02/24/2021 1141   PROT 7.2 02/24/2021 1141   ALBUMIN 4.9 (H) 02/24/2021 1141   AST 21 02/24/2021 1141   ALT 21 02/24/2021 1141   ALKPHOS 75 02/24/2021 1141   BILITOT 0.4 02/24/2021 1141   GFRNONAA 79 11/27/2018 0839   GFRAA  91 11/27/2018 0839   Lab Results  Component Value Date   CHOL 179 05/07/2011   HDL 49 05/07/2011   LDLCALC 109 (H) 05/07/2011   TRIG 104 05/07/2011   CHOLHDL 3.7 05/07/2011   No results found for: "HGBA1C" No results found for: "VITAMINB12" Lab Results  Component Value Date   TSH 2.495 05/06/2011    Butler Denmark, AGNP-C, DNP 05/12/2022, 1:42 PM [facility] [address], [place],  [address] [phone]

## 2022-05-12 ENCOUNTER — Encounter: Payer: Self-pay | Admitting: Neurology

## 2022-05-12 ENCOUNTER — Ambulatory Visit (INDEPENDENT_AMBULATORY_CARE_PROVIDER_SITE_OTHER): Payer: PRIVATE HEALTH INSURANCE | Admitting: Neurology

## 2022-05-12 VITALS — BP 104/70 | HR 84 | Ht 63.0 in | Wt 142.0 lb

## 2022-05-12 DIAGNOSIS — G35 Multiple sclerosis: Secondary | ICD-10-CM

## 2022-05-12 MED ORDER — BUPROPION HCL ER (XL) 300 MG PO TB24: 300.0000 mg | ORAL_TABLET | Freq: Every day | ORAL | 3 refills | Status: DC

## 2022-05-12 NOTE — Patient Instructions (Signed)
Please reach out to me within the next few months, certainly within the next 6 months to schedule an MRI of the brain  We will see you back in 1 year Call for any worsening symptoms

## 2022-06-06 ENCOUNTER — Encounter: Payer: Self-pay | Admitting: Podiatry

## 2022-06-06 ENCOUNTER — Ambulatory Visit (INDEPENDENT_AMBULATORY_CARE_PROVIDER_SITE_OTHER): Payer: PRIVATE HEALTH INSURANCE

## 2022-06-06 ENCOUNTER — Ambulatory Visit (INDEPENDENT_AMBULATORY_CARE_PROVIDER_SITE_OTHER): Payer: PRIVATE HEALTH INSURANCE | Admitting: Podiatry

## 2022-06-06 VITALS — BP 122/76 | HR 81

## 2022-06-06 DIAGNOSIS — M7751 Other enthesopathy of right foot: Secondary | ICD-10-CM

## 2022-06-06 DIAGNOSIS — M722 Plantar fascial fibromatosis: Secondary | ICD-10-CM

## 2022-06-06 DIAGNOSIS — M205X1 Other deformities of toe(s) (acquired), right foot: Secondary | ICD-10-CM

## 2022-06-06 MED ORDER — TRIAMCINOLONE ACETONIDE 10 MG/ML IJ SUSP
10.0000 mg | Freq: Once | INTRAMUSCULAR | Status: AC
  Administered 2022-06-06: 10 mg

## 2022-06-08 NOTE — Progress Notes (Signed)
Subjective:   Patient ID: [name], female   DOB: 67 y.o.   MRN: [id number]   HPI Patient presents with pain which has mostly been in the first metatarsal phalangeal joint right with history also of heel pain but not recently.  Patient states that it has been very sore and that it is hard when she tries to walk   ROS      Objective:  Physical Exam  Neurovascular status intact with inflammation pain of the first MPJ right foot fluid buildup around the joint painful when palpated     Assessment:  Inflammatory capsulitis of the first MPJ right foot with fluid buildup around the joint     Plan:  H&P reviewed sterile prep and injected around the first MPJ 3 mg dexamethasone Kenalog 5 mg Xylocaine advised on rigid bottom shoes and reappoint May require more aggressive treatment or possible orthotics or other treatment.  X-rays indicate there is elongation first metatarsal segment right beginnings of spur formation mild changes around the joint surface

## 2022-06-28 ENCOUNTER — Other Ambulatory Visit: Payer: Self-pay | Admitting: Podiatry

## 2022-06-28 DIAGNOSIS — M7751 Other enthesopathy of right foot: Secondary | ICD-10-CM

## 2022-06-28 DIAGNOSIS — M205X1 Other deformities of toe(s) (acquired), right foot: Secondary | ICD-10-CM

## 2022-06-28 DIAGNOSIS — M722 Plantar fascial fibromatosis: Secondary | ICD-10-CM

## 2022-07-18 ENCOUNTER — Ambulatory Visit: Payer: PRIVATE HEALTH INSURANCE | Admitting: Podiatry

## 2022-09-05 ENCOUNTER — Encounter: Payer: Self-pay | Admitting: Podiatry

## 2022-09-05 ENCOUNTER — Ambulatory Visit (INDEPENDENT_AMBULATORY_CARE_PROVIDER_SITE_OTHER): Payer: PRIVATE HEALTH INSURANCE | Admitting: Podiatry

## 2022-09-05 DIAGNOSIS — M722 Plantar fascial fibromatosis: Secondary | ICD-10-CM | POA: Diagnosis not present

## 2022-09-05 DIAGNOSIS — M7751 Other enthesopathy of right foot: Secondary | ICD-10-CM

## 2022-09-05 MED ORDER — METHYLPREDNISOLONE 4 MG PO TBPK: ORAL_TABLET | ORAL | 0 refills | Status: DC

## 2022-09-05 MED ORDER — TRIAMCINOLONE ACETONIDE 40 MG/ML IJ SUSP
20.0000 mg | Freq: Once | INTRAMUSCULAR | Status: AC
  Administered 2022-09-05: 20 mg

## 2022-09-05 MED ORDER — MELOXICAM 15 MG PO TABS: 15.0000 mg | ORAL_TABLET | Freq: Every day | ORAL | 3 refills | Status: DC

## 2022-09-05 NOTE — Progress Notes (Signed)
She presents today usually seen by Dr. Charlsie Merles with a chief complaint of pain to the first metatarsal phalangeal joint of the right foot.  She states this been going on now since January or February and is consistently inconsistent.  She states that she is starting to notice it:.  States that the injection really did not help.  Objective: Vital signs are stable alert and oriented x 3.  Pulses are palpable.  She has exquisite pain on palpation medial calcaneal tubercle of the right heel which she states she had not noticed.  She has pain on end range of motion of the first metatarsophalangeal joint with no crepitation.  Radiographs taken today demonstrate an osseously mature individual with generalized demineralization.  Soft tissue increase in density plantar fascial Caney insertion site and joint space narrowing of the first metatarsophalangeal joint.  No dorsal spurring is noted.  These radiographs were taken in February.  Assessment: Planter fasciitis most likely resulting in capsulitis of the first metatarsophalangeal joint of the right foot.  Plan: Discussed etiology pathology and surgical therapies at this point started her on methylprednisolone to be followed by meloxicam.  I injected the right heel today did not inject the first metatarsal phalangeal joint.  Right heel was injected with 20 mg Kenalog 5 mg Marcaine point maximal tenderness.  I discussed appropriate shoe gear stretching exercise ice therapy sugar modifications I will follow-up with her in 1 month.

## 2022-10-06 ENCOUNTER — Ambulatory Visit: Payer: PRIVATE HEALTH INSURANCE | Admitting: Podiatry

## 2023-04-11 ENCOUNTER — Other Ambulatory Visit: Payer: Self-pay | Admitting: Family Medicine

## 2023-04-11 DIAGNOSIS — Z1231 Encounter for screening mammogram for malignant neoplasm of breast: Secondary | ICD-10-CM

## 2023-05-05 ENCOUNTER — Ambulatory Visit: Payer: PRIVATE HEALTH INSURANCE

## 2023-05-12 ENCOUNTER — Ambulatory Visit: Payer: PRIVATE HEALTH INSURANCE

## 2023-05-16 ENCOUNTER — Ambulatory Visit (INDEPENDENT_AMBULATORY_CARE_PROVIDER_SITE_OTHER): Payer: Medicare Other | Admitting: Neurology

## 2023-05-16 ENCOUNTER — Telehealth: Payer: Self-pay | Admitting: Neurology

## 2023-05-16 ENCOUNTER — Encounter: Payer: Self-pay | Admitting: Neurology

## 2023-05-16 VITALS — BP 126/78 | HR 79 | Ht 63.0 in | Wt 142.5 lb

## 2023-05-16 DIAGNOSIS — G35 Multiple sclerosis: Secondary | ICD-10-CM

## 2023-05-16 DIAGNOSIS — R269 Unspecified abnormalities of gait and mobility: Secondary | ICD-10-CM | POA: Diagnosis not present

## 2023-05-16 DIAGNOSIS — R200 Anesthesia of skin: Secondary | ICD-10-CM

## 2023-05-16 MED ORDER — ALPRAZOLAM 0.5 MG PO TABS: ORAL_TABLET | ORAL | 0 refills | Status: AC

## 2023-05-16 MED ORDER — BUPROPION HCL ER (XL) 300 MG PO TB24: 300.0000 mg | ORAL_TABLET | Freq: Every day | ORAL | 3 refills | Status: DC

## 2023-05-16 NOTE — Telephone Encounter (Signed)
 no auth required sent to [facility] [phone]

## 2023-05-16 NOTE — Progress Notes (Signed)
 [facility]  PATIENT: [name] DOB: 10/09/1955  REFERRING DOCTOR OR PCP:  Montie Pizza, MD SOURCE: patient, notes from Dr. Nori Born NP, imaging and lab results, MRI images personally reviewed  _________________________________   HISTORICAL  CHIEF COMPLAINT:  Chief Complaint  Patient presents with   Room 10    Pt is here Alone. Pt states that she has tingling in her right hand. Pt states that the right side of her face is numb. Pt states that other than that she has been stable. Pt would like to discuss about wether or not she needs to get another MRI. Pt needs refill on Wellbutrin  300mg .     HISTORY OF PRESENT ILLNESS:  [name] is a 68 y.o. woman with multiple sclerosis.  History of MS: She was diagnosed with MS in 1986 after presenting with a Lhermitte sign and bilateral leg numbness.  Gait was affected.   Symptoms improved with steroid.  She had a few other exacerbations and steroid courses with last exacerbation in 1996.   She started Avonex  in 1996 and remained stable.     Avonex  was d/c in 2023.      Currently, she is doing well.   She has no recent exacerbations.   She denies major issues    She uses the bannister on the stairs when she goes down but not up.   She can walk 3 miles and keep up with friends.    She denies weakness and spasticity.   She has a psychologist, educational and works out a couple times a week.  Since the 1980's she has had mild right facial, arm and leg numbness, most noticeable when tired -- this is mildly annoying but not painful.    Coordination is good.  Bladder function is fine.  Vision is ok -- she wears contact lenses but no definite MS related issue.  She feels her energy level is fine with no significant fatigue.   She denies insomnia.   No mood or cognitive issues.  She has been on Wellbutrin  times many years and would like to remain on the medication.   IMAGING personally reviewed MRI Brain 03/23/2021  Multiple T2/FLAIR hyperintense foci in the hemispheres in a pattern consistent with chronic demyelinating plaque associated with multiple sclerosis. None of the foci appear to be acute. They do not enhance. Compared to the MRI dated 10/31/2016, there are no new lesions.   MRI cervical and thoracic spine 11/05/2004:  No demyelinating plaques.   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     REVIEW OF SYSTEMS: Constitutional: No fevers, chills, sweats, or change in appetite Eyes: No visual changes, double vision, eye pain Ear, nose and throat: No hearing loss, ear pain, nasal congestion, sore throat Cardiovascular: No chest pain, palpitations Respiratory:  No shortness of breath at rest or with exertion.   No wheezes GastrointestinaI: No nausea, vomiting, diarrhea, abdominal pain, fecal incontinence Genitourinary:  No dysuria, urinary retention or frequency.  No nocturia. Musculoskeletal:  No neck pain, back pain Integumentary: No rash, pruritus, skin lesions Neurological: as above Psychiatric: No depression at this time.  No anxiety Endocrine: No palpitations, diaphoresis, change in appetite, change in weigh or increased thirst Hematologic/Lymphatic:  No anemia, purpura, petechiae. Allergic/Immunologic: No itchy/runny eyes, nasal congestion, recent allergic reactions, rashes  ALLERGIES: Allergies  Allergen Reactions   Codeine     Other Reaction(s): Unknown   Pramipexole      Other Reaction(s): dizzy   Sulfa Antibiotics Hives  HOME MEDICATIONS:  Current Outpatient Medications:    buPROPion  (WELLBUTRIN  XL) 300 MG 24 hr tablet, Take 1 tablet (300 mg total) by mouth daily., Disp: 90 tablet, Rfl: 3   CALCIUM PO, Take 1 tablet by mouth daily., Disp: , Rfl:    ibuprofen (ADVIL,MOTRIN) 200 MG tablet, Take 600 mg by mouth every 6 (six) hours as needed.  , Disp: , Rfl:    Melatonin 3 MG TABS, Take 1.5 tablets by mouth as needed., Disp: , Rfl:    pyridOXINE (B-6) 50 MG tablet, Take 50 mg by  mouth daily., Disp: , Rfl:    Turmeric 400 MG CAPS, Take by mouth., Disp: , Rfl:    valACYclovir (VALTREX) 1000 MG tablet, 2 tablets Orally every 12 hours as needed for cold sore, Disp: , Rfl:    vitamin B-12 (CYANOCOBALAMIN) 100 MCG tablet, Take 100 mcg by mouth daily., Disp: , Rfl:    meloxicam  (MOBIC ) 15 MG tablet, Take 1 tablet (15 mg total) by mouth daily. (Patient not taking: Reported on 05/16/2023), Disp: 30 tablet, Rfl: 3   methylPREDNISolone  (MEDROL  DOSEPAK) 4 MG TBPK tablet, 6 day dose pack - take as directed (Patient not taking: Reported on 05/16/2023), Disp: 21 tablet, Rfl: 0  PAST MEDICAL HISTORY: Past Medical History:  Diagnosis Date   Multiple sclerosis (HCC)    PONV (postoperative nausea and vomiting)     PAST SURGICAL HISTORY: Past Surgical History:  Procedure Laterality Date   CHOLECYSTECTOMY      FAMILY HISTORY: Family History  Problem Relation Age of Onset   Diabetes Mother    Breast cancer Mother 71   Diabetes Father    Diabetes Sister     SOCIAL HISTORY: Social History   Socioeconomic History   Marital status: Married    Spouse name: Alm    Number of children: 2   Years of education: BA    Highest education level: Not on file  Occupational History    Employer: [employer]  Tobacco Use   Smoking status: Never   Smokeless tobacco: Never  Substance and Sexual Activity   Alcohol use: Yes    Comment: socially 3-5 per week    Drug use: No   Sexual activity: Never  Other Topics Concern   Not on file  Social History Narrative   Patient lives at home with her husband and she has 2 children. 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    Social Drivers of Corporate Investment Banker Strain: Not on file  Food Insecurity: Not on file  Transportation Needs: Not on file  Physical Activity: Not on file  Stress: Not on file  Social Connections: Not on file  Intimate Partner Violence: Not on file        PHYSICAL EXAM  Vitals:   05/16/23 0835  BP: 126/78  Pulse: 79  Weight: 142 lb 8 oz (64.6 kg)  Height: 5' 3 (1.6 m)    Body mass index is 25.24 kg/m.   General: The patient is well-developed and well-nourished and in no acute distress  HEENT:  Head is [place]/AT.  Sclera are anicteric.   Neck: No carotid bruits are noted.  The neck is nontender.  Cardiovascular: The heart has a regular rate and rhythm with a normal S1 and S2. There were no murmurs, gallops or rubs.    Skin: Extremities are without rash or  edema.  Musculoskeletal:  Back is nontender  Neurologic Exam  Mental status: The patient  is alert and oriented x 3 at the time of the examination. The patient has apparent normal recent and remote memory, with an apparently normal attention span and concentration ability.   Speech is normal.  Cranial nerves: Extraocular movements are full. Pupils are equal, round, and reactive to light and accomodation.  Color vision was symmetric.  Facial symmetry is present. There is good facial sensation to soft touch bilaterally.Facial strength is normal.  Trapezius and sternocleidomastoid strength is normal. No dysarthria is noted.  The tongue is midline, and the patient has symmetric elevation of the soft palate. No obvious hearing deficits are noted.  Motor:  Muscle bulk is normal.   Tone is normal. Strength is  5 / 5 in all 4 extremities.   Sensory: Sensory testing is intact to pinprick, soft touch and vibration sensation in all 4 extremities.  Coordination: Cerebellar testing reveals good finger-nose-finger and heel-to-shin bilaterally.  Gait and station: Station is normal.   Gait is normal. Tandem gait is mildly wide. Romberg is negative.   Reflexes: Deep tendon reflexes are symmetric and normal in the arms but increased in the legs, right greater than left.  She had spread at the knees and 2 beats of nonsustained clonus at the right ankle.   Plantar responses are  flexor.    DIAGNOSTIC DATA (LABS, IMAGING, TESTING) - I reviewed patient records, labs, notes, testing and imaging myself where available.  Lab Results  Component Value Date   WBC 5.3 11/27/2018   HGB 14.1 11/27/2018   HCT 41.6 11/27/2018   MCV 90 11/27/2018   PLT 258 11/27/2018      Component Value Date/Time   NA 141 02/24/2021 1141   K 4.3 02/24/2021 1141   CL 100 02/24/2021 1141   CO2 26 02/24/2021 1141   GLUCOSE 87 02/24/2021 1141   GLUCOSE 98 05/07/2011 0459   BUN 14 02/24/2021 1141   CREATININE 0.78 02/24/2021 1141   CALCIUM 10.6 (H) 02/24/2021 1141   PROT 7.2 02/24/2021 1141   ALBUMIN 4.9 (H) 02/24/2021 1141   AST 21 02/24/2021 1141   ALT 21 02/24/2021 1141   ALKPHOS 75 02/24/2021 1141   BILITOT 0.4 02/24/2021 1141   GFRNONAA 79 11/27/2018 0839   GFRAA 91 11/27/2018 0839   Lab Results  Component Value Date   CHOL 179 05/07/2011   HDL 49 05/07/2011   LDLCALC 109 (H) 05/07/2011   TRIG 104 05/07/2011   CHOLHDL 3.7 05/07/2011   No results found for: HGBA1C No results found for: VITAMINB12 Lab Results  Component Value Date   TSH 2.495 05/06/2011       ASSESSMENT AND PLAN  Multiple sclerosis (HCC) - Plan: MR BRAIN W WO CONTRAST  Numbness - Plan: MR BRAIN W WO CONTRAST  Gait disturbance - Plan: MR BRAIN W WO CONTRAST   In summary, [name] is a 68 year old woman with multiple sclerosis diagnosed in 1986 who had been on Avonex  therapy between 1996 and 2023.  While for the medication she has done well and has had no exacerbations.  The natural history of MS is that there is less inflammation with age and the duration of the disease.  Therefore, there is a high likelihood that she will continue to remain off of her medication.  We need to check an MRI of the brain to determine if there is any breakthrough activity.  If this is present we would need to reconsider restarting a disease modifying therapy.  I will renew the Wellbutrin .  She will return to  see us  in about a year for regular follow-up visit.  However, if there is breakthrough activity and we need to initiate a disease modifying therapy, she may require a sooner appointment.  42-minute office visit with the majority of the time spent face-to-face for history and physical, discussion/counseling and decision-making.  Additional time with record review and documentation.  This visit is part of a comprehensive longitudinal care medical relationship regarding the patients primary diagnosis of MS and related concerns.   Duard Spiewak A. Vear, MD, [facility] 05/16/2023, 9:03 AM Certified in Neurology, Clinical Neurophysiology, Sleep Medicine and Neuroimaging  [facility] [address] [place], [place] [address] [phone]

## 2023-05-24 ENCOUNTER — Ambulatory Visit
Admission: RE | Admit: 2023-05-24 | Discharge: 2023-05-24 | Discharge status: Home / Self Care | Payer: Medicare Other | Source: Ambulatory Visit | Attending: Family Medicine | Admitting: Family Medicine

## 2023-05-24 DIAGNOSIS — Z1231 Encounter for screening mammogram for malignant neoplasm of breast: Secondary | ICD-10-CM

## 2023-06-27 ENCOUNTER — Ambulatory Visit
Admission: RE | Admit: 2023-06-27 | Discharge: 2023-06-27 | Disposition: A | Discharge status: Home / Self Care | Payer: Medicare Other | Source: Ambulatory Visit | Attending: Neurology | Admitting: Neurology

## 2023-06-27 ENCOUNTER — Encounter: Payer: Self-pay | Admitting: Neurology

## 2023-06-27 DIAGNOSIS — R200 Anesthesia of skin: Secondary | ICD-10-CM

## 2023-06-27 DIAGNOSIS — G35 Multiple sclerosis: Secondary | ICD-10-CM

## 2023-06-27 DIAGNOSIS — R269 Unspecified abnormalities of gait and mobility: Secondary | ICD-10-CM

## 2023-06-27 MED ORDER — GADOPICLENOL 0.5 MMOL/ML IV SOLN
6.0000 mL | Freq: Once | INTRAVENOUS | Status: AC | PRN
  Administered 2023-06-27: 6 mL via INTRAVENOUS

## 2023-09-02 IMAGING — MG MM DIGITAL SCREENING BILAT W/ TOMO AND CAD
8 series · 9 of 24 positions shown · non-contrast
Comparison: Previous exam(s).

CLINICAL DATA: Screening.

EXAM:
DIGITAL SCREENING BILATERAL MAMMOGRAM WITH TOMOSYNTHESIS AND CAD
TECHNIQUE: Bilateral screening digital craniocaudal and mediolateral oblique
mammograms were obtained. Bilateral screening digital breast
tomosynthesis was performed. The images were evaluated with
computer-aided detection.

[L MLO synth-2D]
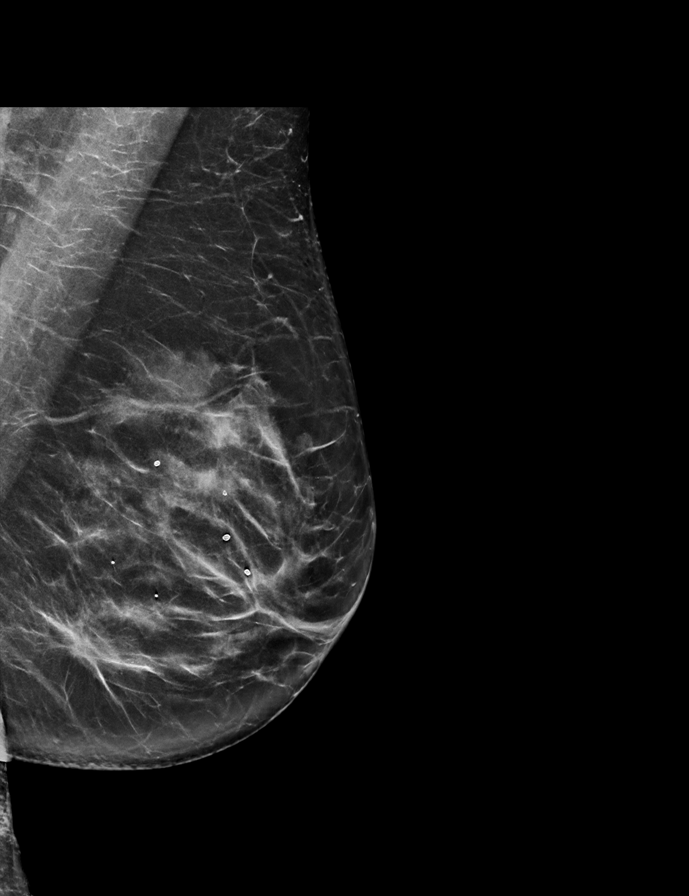

[R CC synth-2D]
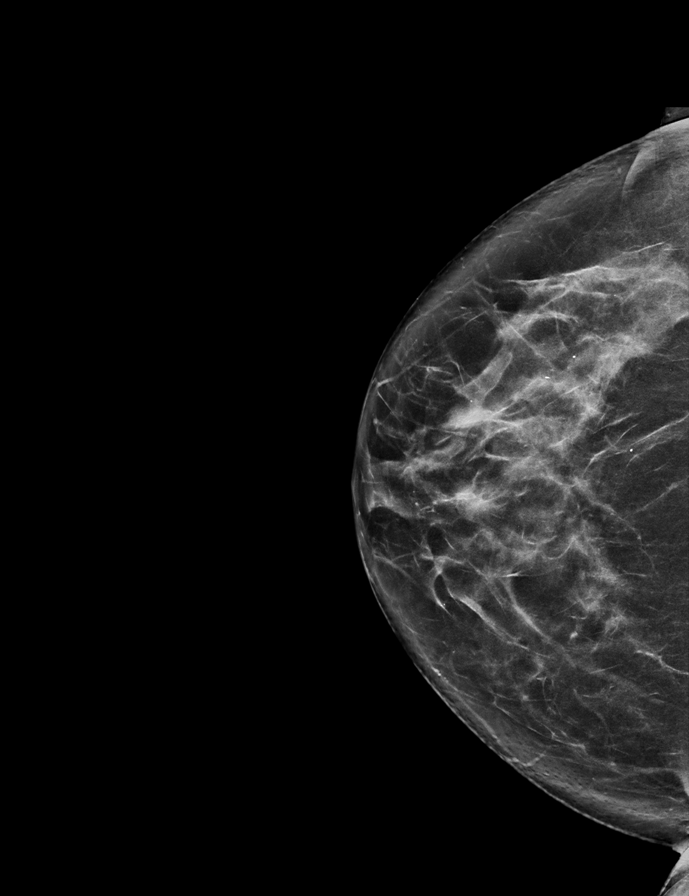

[L CC synth-2D]
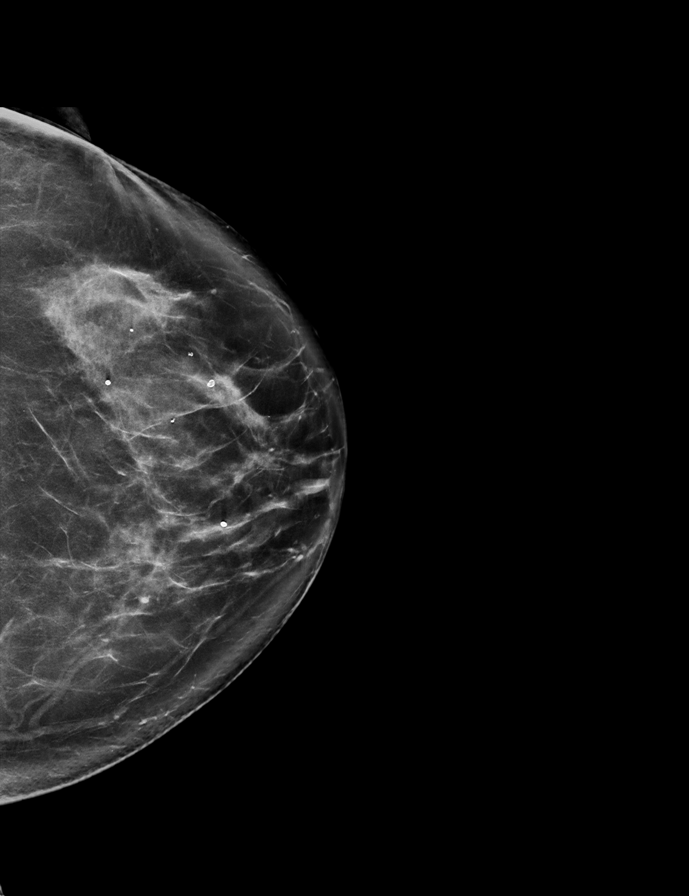

[R MLO synth-2D]
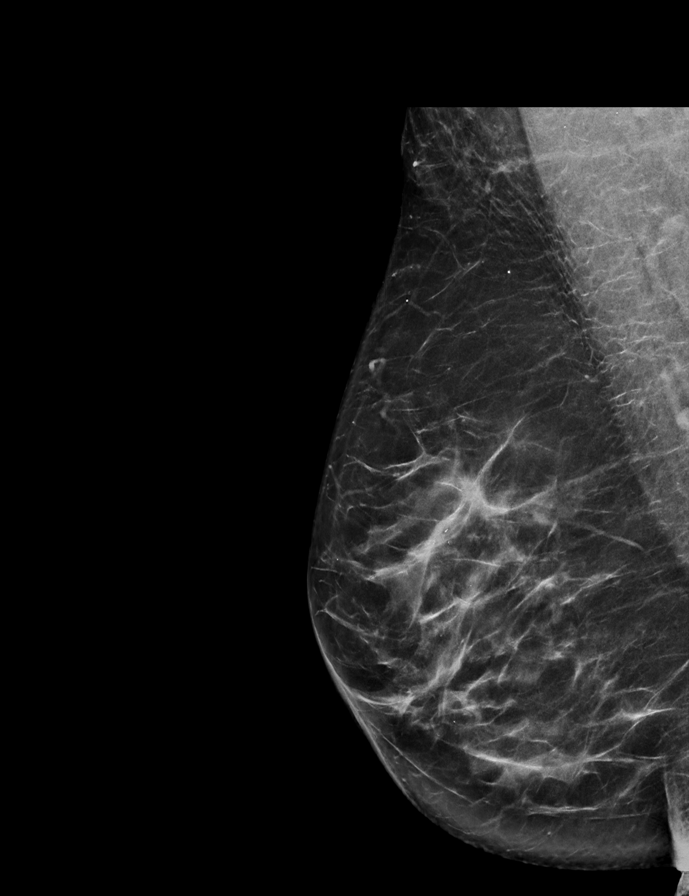

[R MLO tomo · 2 of 74 frames shown]
[frame 24/74]
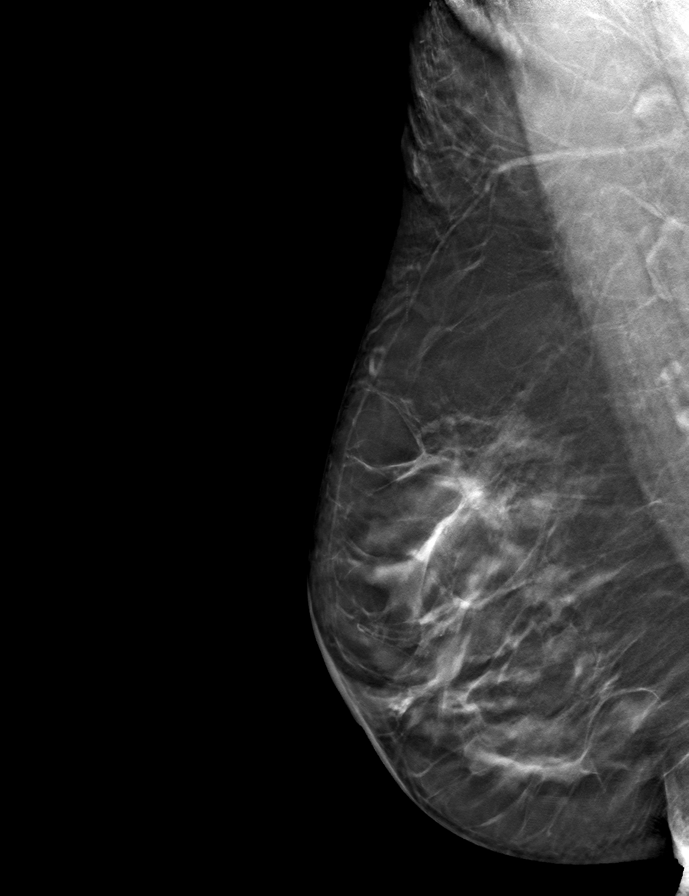
[frame 37/74]
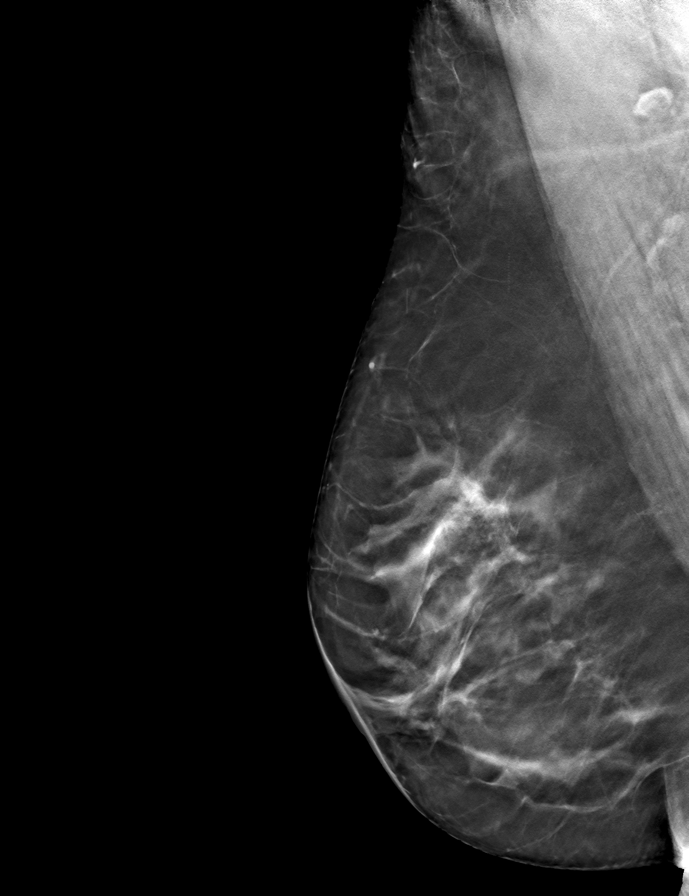

[L MLO tomo · tomo slice 39/77.0]
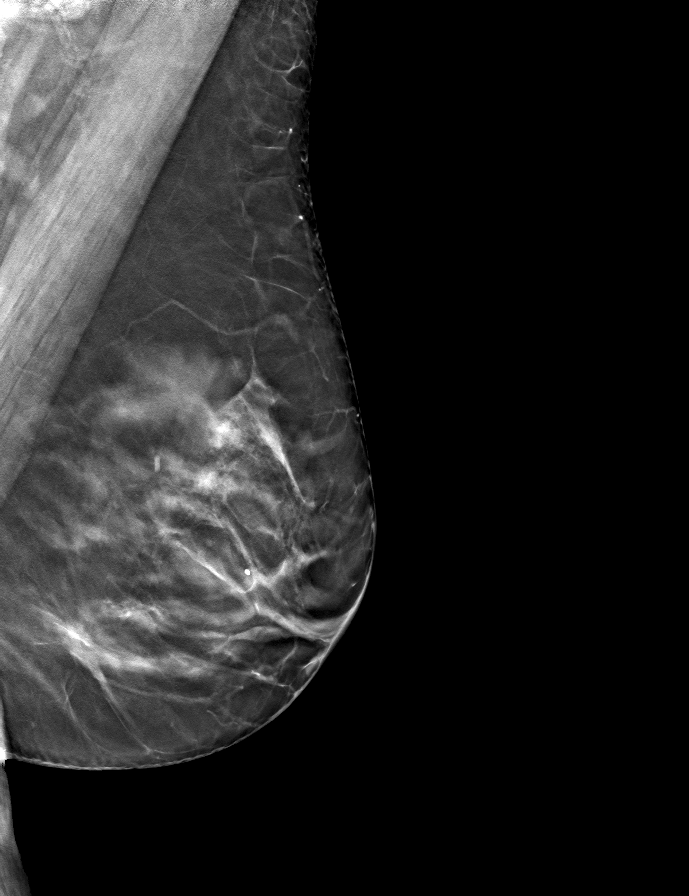

[L CC tomo · tomo slice 43/84.0]
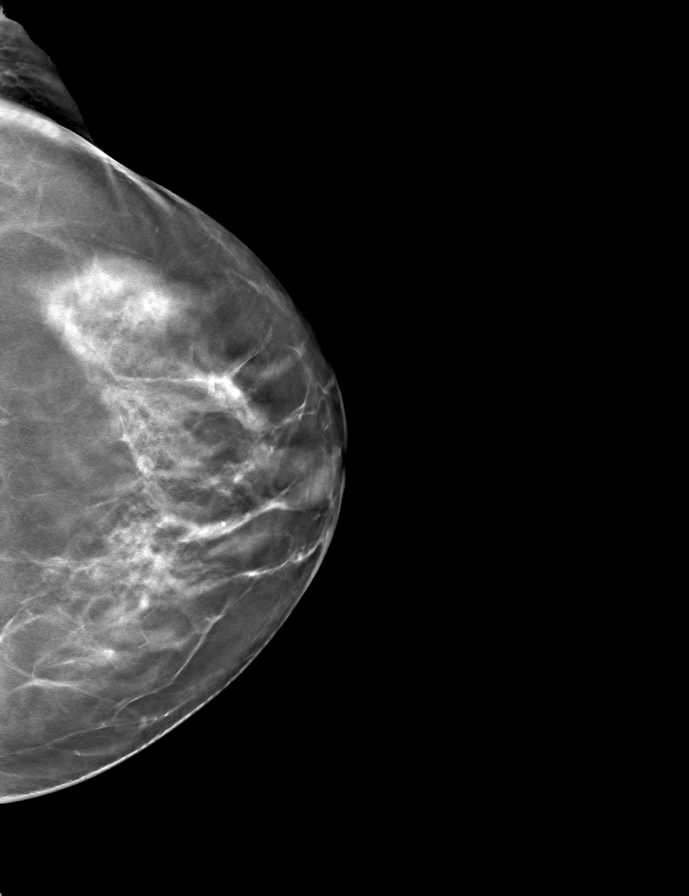

[R CC tomo · tomo slice 36/71.0]
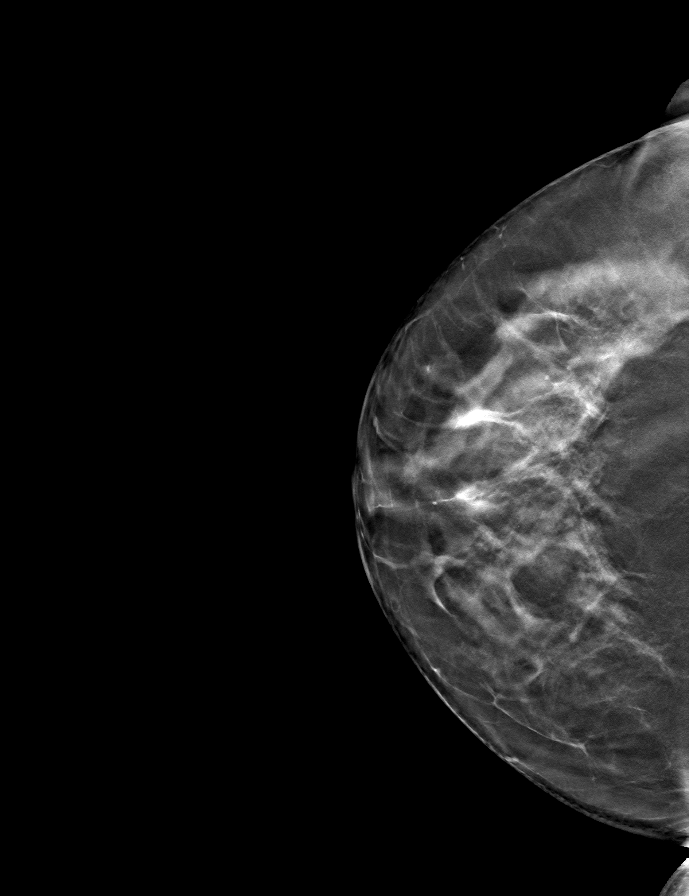

[9 of 24 positions shown; findings below may reference images not displayed]

ACR Breast Density Category c: The breast tissue is heterogeneously
dense, which may obscure small masses.
FINDINGS: There are no findings suspicious for malignancy.
IMPRESSION: No mammographic evidence of malignancy. A result letter of this
screening mammogram will be mailed directly to the patient.

RECOMMENDATION:
Screening mammogram in one year. (Code:Q3-W-BC3)

BI-RADS CATEGORY  1: Negative.

## 2023-10-18 ENCOUNTER — Other Ambulatory Visit (HOSPITAL_BASED_OUTPATIENT_CLINIC_OR_DEPARTMENT_OTHER): Payer: Self-pay | Admitting: Family Medicine

## 2023-10-18 DIAGNOSIS — M8580 Other specified disorders of bone density and structure, unspecified site: Secondary | ICD-10-CM

## 2023-12-14 ENCOUNTER — Ambulatory Visit (INDEPENDENT_AMBULATORY_CARE_PROVIDER_SITE_OTHER): Admitting: Podiatry

## 2023-12-14 ENCOUNTER — Encounter: Payer: Self-pay | Admitting: Podiatry

## 2023-12-14 DIAGNOSIS — J301 Allergic rhinitis due to pollen: Secondary | ICD-10-CM | POA: Insufficient documentation

## 2023-12-14 DIAGNOSIS — M722 Plantar fascial fibromatosis: Secondary | ICD-10-CM

## 2023-12-14 DIAGNOSIS — Z8619 Personal history of other infectious and parasitic diseases: Secondary | ICD-10-CM | POA: Insufficient documentation

## 2023-12-14 DIAGNOSIS — E785 Hyperlipidemia, unspecified: Secondary | ICD-10-CM | POA: Insufficient documentation

## 2023-12-14 DIAGNOSIS — M7751 Other enthesopathy of right foot: Secondary | ICD-10-CM

## 2023-12-14 DIAGNOSIS — M8580 Other specified disorders of bone density and structure, unspecified site: Secondary | ICD-10-CM | POA: Insufficient documentation

## 2023-12-14 DIAGNOSIS — Z860100 Personal history of colon polyps, unspecified: Secondary | ICD-10-CM | POA: Insufficient documentation

## 2023-12-14 DIAGNOSIS — E559 Vitamin D deficiency, unspecified: Secondary | ICD-10-CM | POA: Insufficient documentation

## 2023-12-14 MED ORDER — MELOXICAM 15 MG PO TABS: 15.0000 mg | ORAL_TABLET | Freq: Every day | ORAL | 3 refills | Status: AC

## 2023-12-14 MED ORDER — TRIAMCINOLONE ACETONIDE 40 MG/ML IJ SUSP
20.0000 mg | Freq: Once | INTRAMUSCULAR | Status: AC
  Administered 2023-12-14: 20 mg

## 2023-12-14 MED ORDER — METHYLPREDNISOLONE 4 MG PO TBPK: ORAL_TABLET | ORAL | 0 refills | Status: AC

## 2023-12-14 NOTE — Progress Notes (Signed)
 She presents today for flareup of her right foot problem again.  She states is lasted over a year and that she has been taking Tylenol that that is really helped for that she has pain about the first metatarsal phalangeal joint and the right medial longitudinal arch.  Objective: Vital signs stable orient x 3 no changes past medical history medications allergies.  She has pain on palpation medial calcaneal tubercle of the right heel with pain on palpation and limited range of motion of the first metatarsophalangeal joint right.  Assessment: Plantar fasciitis and capsulitis first metatarsophalangeal joint.  Plan: I injected the plantar fascia today started on Medrol Dosepak to be followed by meloxicam.  Discussed appropriate shoe gear stretching exercise ice therapy shoe gear modifications.

## 2023-12-28 ENCOUNTER — Ambulatory Visit: Admitting: Podiatry

## 2024-01-25 ENCOUNTER — Ambulatory Visit: Admitting: Podiatry

## 2024-04-02 ENCOUNTER — Encounter: Payer: Self-pay | Admitting: Podiatry

## 2024-04-02 ENCOUNTER — Ambulatory Visit: Admitting: Podiatry

## 2024-04-02 DIAGNOSIS — M722 Plantar fascial fibromatosis: Secondary | ICD-10-CM | POA: Diagnosis not present

## 2024-04-02 NOTE — Progress Notes (Signed)
 She presents today for a follow-up of her plantar fasciitis and osteoarthritis states that her fasciitis is doing much better I am developing a stabbing pain along the ankle over to my midfoot.  As she points to the tibialis anterior tendon area.  Objective: Vital signs are stable alert and oriented x 3.  Pulses are palpable.  She has fluctuance within the tibialis anterior tendon sheath near its insertion site.  Most likely from compensatory changes with plantar fasciitis.  Assessment: Residual plantar fasciitis tibialis anterior tendinitis.  Plan: Encouraged her to continue the anti-inflammatories and dispensed a night brace to help keep the tension off of the tibialis anterior tendon.  I will follow-up with her in a few weeks.

## 2024-04-19 ENCOUNTER — Other Ambulatory Visit: Payer: Self-pay | Admitting: Neurology

## 2024-05-06 ENCOUNTER — Other Ambulatory Visit: Payer: Self-pay | Admitting: Family Medicine

## 2024-05-06 DIAGNOSIS — Z1231 Encounter for screening mammogram for malignant neoplasm of breast: Secondary | ICD-10-CM

## 2024-05-15 ENCOUNTER — Ambulatory Visit: Payer: BLUE CROSS/BLUE SHIELD | Admitting: Neurology

## 2024-05-24 ENCOUNTER — Ambulatory Visit
Admission: RE | Admit: 2024-05-24 | Discharge: 2024-05-24 | Disposition: A | Source: Ambulatory Visit | Attending: Family Medicine | Admitting: Family Medicine

## 2024-05-24 DIAGNOSIS — Z1231 Encounter for screening mammogram for malignant neoplasm of breast: Secondary | ICD-10-CM

## 2024-05-25 ENCOUNTER — Other Ambulatory Visit: Payer: Self-pay | Admitting: Medical Genetics

## 2024-05-25 ENCOUNTER — Other Ambulatory Visit (HOSPITAL_COMMUNITY): Payer: Self-pay

## 2024-05-25 MED ORDER — AMOXICILLIN 875 MG PO TABS
ORAL_TABLET | ORAL | 0 refills | Status: AC
  Filled 2024-05-25: qty 20, 10d supply, fill #0

## 2024-05-25 MED ORDER — CIPROFLOXACIN-DEXAMETHASONE 0.3-0.1 % OT SUSP
OTIC | 0 refills | Status: AC
  Filled 2024-05-25: qty 7.5, 10d supply, fill #0

## 2024-06-04 ENCOUNTER — Ambulatory Visit (INDEPENDENT_AMBULATORY_CARE_PROVIDER_SITE_OTHER): Admitting: Podiatry

## 2024-06-04 ENCOUNTER — Encounter: Payer: Self-pay | Admitting: Podiatry

## 2024-06-04 DIAGNOSIS — M76811 Anterior tibial syndrome, right leg: Secondary | ICD-10-CM | POA: Diagnosis not present

## 2024-06-04 MED ORDER — DEXAMETHASONE SODIUM PHOSPHATE 120 MG/30ML IJ SOLN
2.0000 mg | Freq: Once | INTRAMUSCULAR | Status: AC
  Administered 2024-06-04: 2 mg via INTRA_ARTICULAR

## 2024-06-04 NOTE — Progress Notes (Signed)
 She presents today for follow-up of her plantar fasciitis and tibialis anterior tendinitis.  She states that the foot is fair as she refers to the right foot since to wear the night splint every night and i would admit that i am approximately 60% improved.  objective: vital signs are stable alert and oriented x 3.  pulses are palpable.  there is no erythema edema salines drainage or odor.  she does have fluctuance and tenderness on palpation of the distal fibers of the tibialis anterior tendon near the medial plantar medial aspect of the longitudinal arch.  assessment tibialis anterior tendinitis possibly associated with compensation or compensatory syndrome from plantar fasciitis.  plan: discussed etiology pathology conservative versus surgical therapies.  at this point i injected 6 mg of dexamethasone  to the point of maximal tenderness making sure not to inject into the tendon itself right foot.  we did discuss our next move would be formal physical therapy to help loosen the musculature in the posterior buttocks thigh and calf and foot.  she understands this and is amenable to it she will notify me by mychart otherwise i will see her in 6 weeks.  she will continue to use of her night splint.

## 2024-06-06 ENCOUNTER — Other Ambulatory Visit (HOSPITAL_COMMUNITY)
Admission: RE | Admit: 2024-06-06 | Discharge: 2024-06-06 | Disposition: A | Payer: Self-pay | Source: Ambulatory Visit | Attending: Medical Genetics | Admitting: Medical Genetics

## 2024-07-16 ENCOUNTER — Ambulatory Visit: Admitting: Podiatry

## 2024-12-18 ENCOUNTER — Ambulatory Visit: Admitting: Neurology
